# Patient Record
Sex: Male | Born: 1995 | Race: Black or African American | Hispanic: No | Marital: Single | Smoking: Current some day smoker
Health system: Southern US, Community
[De-identification: ages and names within clinical notes are randomized; demographics above are authoritative.]

## PROBLEM LIST (undated history)

## (undated) DIAGNOSIS — R569 Unspecified convulsions: Secondary | ICD-10-CM

---
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
## 2013-01-04 ENCOUNTER — Encounter (HOSPITAL_COMMUNITY): Payer: Self-pay | Admitting: *Deleted

## 2013-01-04 ENCOUNTER — Emergency Department (HOSPITAL_COMMUNITY)
Admission: EM | Admit: 2013-01-04 | Discharge: 2013-01-04 | Disposition: A | Discharge status: Home / Self Care | Payer: No Typology Code available for payment source | Attending: Emergency Medicine | Admitting: Emergency Medicine

## 2013-01-04 ENCOUNTER — Emergency Department (HOSPITAL_COMMUNITY): Payer: No Typology Code available for payment source

## 2013-01-04 DIAGNOSIS — Y92410 Unspecified street and highway as the place of occurrence of the external cause: Secondary | ICD-10-CM | POA: Insufficient documentation

## 2013-01-04 DIAGNOSIS — S2239XA Fracture of one rib, unspecified side, initial encounter for closed fracture: Secondary | ICD-10-CM | POA: Insufficient documentation

## 2013-01-04 DIAGNOSIS — S40011A Contusion of right shoulder, initial encounter: Secondary | ICD-10-CM

## 2013-01-04 DIAGNOSIS — S20219A Contusion of unspecified front wall of thorax, initial encounter: Secondary | ICD-10-CM | POA: Insufficient documentation

## 2013-01-04 DIAGNOSIS — S42309A Unspecified fracture of shaft of humerus, unspecified arm, initial encounter for closed fracture: Secondary | ICD-10-CM

## 2013-01-04 DIAGNOSIS — S20211A Contusion of right front wall of thorax, initial encounter: Secondary | ICD-10-CM

## 2013-01-04 DIAGNOSIS — Y939 Activity, unspecified: Secondary | ICD-10-CM | POA: Insufficient documentation

## 2013-01-04 DIAGNOSIS — S40019A Contusion of unspecified shoulder, initial encounter: Secondary | ICD-10-CM | POA: Insufficient documentation

## 2013-01-04 MED ORDER — NAPROXEN 375 MG PO TABS: 375.0000 mg | ORAL_TABLET | Freq: Two times a day (BID) | ORAL | Status: DC | PRN

## 2013-01-04 MED ORDER — IBUPROFEN 400 MG PO TABS
600.0000 mg | ORAL_TABLET | Freq: Once | ORAL | Status: AC
  Administered 2013-01-04: 600 mg via ORAL
  Filled 2013-01-04: qty 1

## 2013-01-04 NOTE — ED Notes (Signed)
Pt's arm placed in sling.  Pt reports feeling better.  Pt's respirations are equal and non labored.
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
## 2013-01-18 ENCOUNTER — Ambulatory Visit: Payer: No Typology Code available for payment source | Attending: Family Medicine

## 2013-01-18 DIAGNOSIS — IMO0001 Reserved for inherently not codable concepts without codable children: Secondary | ICD-10-CM | POA: Insufficient documentation

## 2013-01-18 DIAGNOSIS — M25519 Pain in unspecified shoulder: Secondary | ICD-10-CM | POA: Insufficient documentation

## 2013-01-20 ENCOUNTER — Ambulatory Visit: Payer: Self-pay

## 2013-01-22 ENCOUNTER — Ambulatory Visit: Payer: Self-pay | Admitting: Rehabilitation

## 2013-01-26 ENCOUNTER — Ambulatory Visit: Payer: Self-pay | Admitting: Rehabilitation

## 2013-01-29 ENCOUNTER — Ambulatory Visit: Payer: No Typology Code available for payment source | Attending: Family Medicine

## 2013-01-29 DIAGNOSIS — IMO0001 Reserved for inherently not codable concepts without codable children: Secondary | ICD-10-CM | POA: Insufficient documentation

## 2013-01-29 DIAGNOSIS — M25519 Pain in unspecified shoulder: Secondary | ICD-10-CM | POA: Insufficient documentation

## 2013-02-02 ENCOUNTER — Ambulatory Visit: Payer: No Typology Code available for payment source | Admitting: Rehabilitation

## 2013-02-04 ENCOUNTER — Encounter: Payer: Self-pay | Admitting: Rehabilitation

## 2013-02-15 DIAGNOSIS — S42309A Unspecified fracture of shaft of humerus, unspecified arm, initial encounter for closed fracture: Secondary | ICD-10-CM

## 2013-03-17 ENCOUNTER — Encounter (HOSPITAL_COMMUNITY): Payer: Self-pay | Admitting: Family Medicine

## 2013-03-17 ENCOUNTER — Emergency Department (HOSPITAL_COMMUNITY)
Admission: EM | Admit: 2013-03-17 | Discharge: 2013-03-17 | Discharge status: Against Medical Advice | Payer: No Typology Code available for payment source | Attending: Emergency Medicine | Admitting: Emergency Medicine

## 2013-03-17 DIAGNOSIS — M549 Dorsalgia, unspecified: Secondary | ICD-10-CM | POA: Insufficient documentation

## 2013-03-17 NOTE — ED Notes (Signed)
Patient called by Landry Corporal, NT without an answer x 3.

## 2013-03-17 NOTE — ED Notes (Signed)
Pt complaining of back pain since the beginning of the month. Denies injury.
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
## 2015-07-09 ENCOUNTER — Emergency Department (HOSPITAL_COMMUNITY)
Admission: EM | Admit: 2015-07-09 | Discharge: 2015-07-09 | Disposition: A | Discharge status: Home / Self Care | Payer: Self-pay | Attending: Emergency Medicine | Admitting: Emergency Medicine

## 2015-07-09 ENCOUNTER — Encounter (HOSPITAL_COMMUNITY): Payer: Self-pay | Admitting: *Deleted

## 2015-07-09 DIAGNOSIS — Z202 Contact with and (suspected) exposure to infections with a predominantly sexual mode of transmission: Secondary | ICD-10-CM | POA: Insufficient documentation

## 2015-07-09 MED ORDER — CEFTRIAXONE SODIUM 250 MG IJ SOLR
250.0000 mg | Freq: Once | INTRAMUSCULAR | Status: DC
  Filled 2015-07-09: qty 250

## 2015-07-09 MED ORDER — METRONIDAZOLE 500 MG PO TABS
2000.0000 mg | ORAL_TABLET | Freq: Once | ORAL | Status: AC
  Administered 2015-07-09: 2000 mg via ORAL
  Filled 2015-07-09: qty 4

## 2015-07-09 MED ORDER — LIDOCAINE HCL (PF) 1 % IJ SOLN
5.0000 mL | Freq: Once | INTRAMUSCULAR | Status: AC
  Administered 2015-07-09: 5 mL
  Filled 2015-07-09: qty 5

## 2015-07-09 MED ORDER — AZITHROMYCIN 250 MG PO TABS
1000.0000 mg | ORAL_TABLET | Freq: Once | ORAL | Status: AC
  Administered 2015-07-09: 1000 mg via ORAL
  Filled 2015-07-09: qty 4

## 2015-07-09 NOTE — Discharge Instructions (Signed)
Please follow-up with health department as needed. You will be contacted if any of your tests come back positive. Always use protection when having intercourse  Sexually Transmitted Disease A sexually transmitted disease (STD) is a disease or infection that may be passed (transmitted) from person to person, usually during sexual activity. This may happen by way of saliva, semen, blood, vaginal mucus, or urine. Common STDs include:  Gonorrhea.  Chlamydia.  Syphilis.  HIV and AIDS.  Genital herpes.  Hepatitis B and C.  Trichomonas.  Human papillomavirus (HPV).  Pubic lice.  Scabies.  Mites.  Bacterial vaginosis. WHAT ARE CAUSES OF STDs? An STD may be caused by bacteria, a virus, or parasites. STDs are often transmitted during sexual activity if one person is infected. However, they may also be transmitted through nonsexual means. STDs may be transmitted after:   Sexual intercourse with an infected person.  Sharing sex toys with an infected person.  Sharing needles with an infected person or using unclean piercing or tattoo needles.  Having intimate contact with the genitals, mouth, or rectal areas of an infected person.  Exposure to infected fluids during birth. WHAT ARE THE SIGNS AND SYMPTOMS OF STDs? Different STDs have different symptoms. Some people may not have any symptoms. If symptoms are present, they may include:  Painful or bloody urination.  Pain in the pelvis, abdomen, vagina, anus, throat, or eyes.  A skin rash, itching, or irritation.  Growths, ulcerations, blisters, or sores in the genital and anal areas.  Abnormal vaginal discharge with or without bad odor.  Penile discharge in men.  Fever.  Pain or bleeding during sexual intercourse.  Swollen glands in the groin area.  Yellow skin and eyes (jaundice). This is seen with hepatitis.  Swollen testicles.  Infertility.  Sores and blisters in the mouth. HOW ARE STDs DIAGNOSED? To make a  diagnosis, your health care provider may:  Take a medical history.  Perform a physical exam.  Take a sample of any discharge to examine.  Swab the throat, cervix, opening to the penis, rectum, or vagina for testing.  Test a sample of your first morning urine.  Perform blood tests.  Perform a Pap test, if this applies.  Perform a colposcopy.  Perform a laparoscopy. HOW ARE STDs TREATED? Treatment depends on the STD. Some STDs may be treated but not cured.  Chlamydia, gonorrhea, trichomonas, and syphilis can be cured with antibiotic medicine.  Genital herpes, hepatitis, and HIV can be treated, but not cured, with prescribed medicines. The medicines lessen symptoms.  Genital warts from HPV can be treated with medicine or by freezing, burning (electrocautery), or surgery. Warts may come back.  HPV cannot be cured with medicine or surgery. However, abnormal areas may be removed from the cervix, vagina, or vulva.  If your diagnosis is confirmed, your recent sexual partners need treatment. This is true even if they are symptom-free or have a negative culture or evaluation. They should not have sex until their health care providers say it is okay.  Your health care provider may test you for infection again 3 months after treatment. HOW CAN I REDUCE MY RISK OF GETTING AN STD? Take these steps to reduce your risk of getting an STD:  Use latex condoms, dental dams, and water-soluble lubricants during sexual activity. Do not use petroleum jelly or oils.  Avoid having multiple sex partners.  Do not have sex with someone who has other sex partners  Do not have sex with anyone you do  not know or who is at high risk for an STD.  Avoid risky sex practices that can break your skin.  Do not have sex if you have open sores on your mouth or skin.  Avoid drinking too much alcohol or taking illegal drugs. Alcohol and drugs can affect your judgment and put you in a vulnerable  position.  Avoid engaging in oral and anal sex acts.  Get vaccinated for HPV and hepatitis. If you have not received these vaccines in the past, talk to your health care provider about whether one or both might be right for you.  If you are at risk of being infected with HIV, it is recommended that you take a prescription medicine daily to prevent HIV infection. This is called pre-exposure prophylaxis (PrEP). You are considered at risk if:  You are a man who has sex with other men (MSM).  You are a heterosexual man or woman and are sexually active with more than one partner.  You take drugs by injection.  You are sexually active with a partner who has HIV.  Talk with your health care provider about whether you are at high risk of being infected with HIV. If you choose to begin PrEP, you should first be tested for HIV. You should then be tested every 3 months for as long as you are taking PrEP. WHAT SHOULD I DO IF I THINK I HAVE AN STD?  See your health care provider.  Tell your sexual partner(s). They should be tested and treated for any STDs.  Do not have sex until your health care provider says it is okay. WHEN SHOULD I GET IMMEDIATE MEDICAL CARE? Contact your health care provider right away if:   You have severe abdominal pain.  You are a man and notice swelling or pain in your testicles.  You are a woman and notice swelling or pain in your vagina.   This information is not intended to replace advice given to you by your health care provider. Make sure you discuss any questions you have with your health care provider.   Document Released: 12/07/2002 Document Revised: 10/07/2014 Document Reviewed: 04/06/2013 Elsevier Interactive Patient Education [employer]

## 2015-07-09 NOTE — ED Provider Notes (Signed)
CSN: [id number]     Arrival date & time 07/09/15  1017 History   By signing my name below, I, Arianna Nassar, attest that this documentation has been prepared under the direction and in the presence of Nahomi Hegner, PA-C. Electronically Signed: Octavia Heir, ED Scribe. 07/09/2015. 11:12 AM.      Chief Complaint  Patient presents with  . Exposure to STD      The history is provided by the patient. No language interpreter was used.   HPI Comments: Timothy Lynch is a 19 y.o. male who presents to the Emergency Department with an STD check. Pt notes his baby mother was dx and treated with trichomoniasis and states he wants to be checked out just in case he potentially has something. He states not using protection when having sexual intercourse. Pt denies penile discharge, penile pain and any other symptoms.  No past medical history on file. No past surgical history on file. No family history on file. Social History  Substance Use Topics  . Smoking status: Never Smoker   . Smokeless tobacco: Not on file  . Alcohol Use: No    Review of Systems  Genitourinary: Negative for discharge and penile pain.      Allergies  Review of patient's allergies indicates no known allergies.  Home Medications   Prior to Admission medications   Medication Sig Start Date End Date Taking? Authorizing Provider  diphenhydramine-acetaminophen (TYLENOL PM) 25-500 MG TABS Take 2 tablets by mouth 2 (two) times daily as needed (pain).    Historical Provider, MD   Triage vitals: BP 116/46 mmHg  Pulse 70  Temp(Src) 98.8 F (37.1 C) (Oral)  Resp 18  Ht 6' 3.5" (1.918 m)  Wt 200 lb (90.719 kg)  BMI 24.66 kg/m2  SpO2 100% Physical Exam  Constitutional: He is oriented to person, place, and time. He appears well-developed and well-nourished. No distress.  HENT:  Head: Normocephalic and atraumatic.  Eyes: Conjunctivae are normal. Right eye exhibits no discharge. Left eye exhibits no discharge.   Neck: Neck supple.  Genitourinary:  Normal genitalia. No penile discharge  Neurological: He is alert and oriented to person, place, and time. Coordination normal.  Skin: Skin is warm and dry. No rash noted. He is not diaphoretic.  Psychiatric: He has a normal mood and affect. His behavior is normal.  Nursing note and vitals reviewed.   ED Course  Procedures  DIAGNOSTIC STUDIES: Oxygen Saturation is 100% on RA, normal by my interpretation.  COORDINATION OF CARE:  11:12 AM Discussed treatment plan with pt at bedside and pt agreed to plan.  Labs Review Labs Reviewed - No data to display  Imaging Review No results found. I have personally reviewed and evaluated these images and lab results as part of my medical decision-making.   EKG Interpretation None      MDM   Final diagnoses:  Exposure to STD     patient with possible exposure to STD. He denies any complaints. Will get GC/ chlamydia, RPR, HIV. Will treat with Rocephin, Zithromax, Flagyl.  Patient refused Rocephin IM. I have instructed him to update his number so we can give him a call if any of the cultures come back positive he needs to be treated. Patient will be discharged home. Follow up with health department  Filed Vitals:   07/09/15 1053 07/09/15 1147  BP: 116/46 124/85  Pulse: 70 70  Temp: 98.8 F (37.1 C) 97.9 F (36.6 C)  TempSrc: Oral Oral  Resp:  18 17  Height: 6' 3.5" (1.918 m)   Weight: 200 lb (90.719 kg)   SpO2: 100% 100%     Jaynie Crumble, PA-C 07/09/15 1317  Nelva Nay, MD 07/09/15 1535

## 2015-07-09 NOTE — ED Notes (Signed)
Pt partner was Dx with a STD and Pt wanted to be checked.

## 2015-07-09 NOTE — ED Notes (Signed)
Declined W/C at D/C and was escorted to lobby by RN. 

## 2015-07-10 LAB — RPR: RPR Ser Ql: NONREACTIVE

## 2015-07-10 LAB — HIV ANTIBODY (ROUTINE TESTING W REFLEX): HIV SCREEN 4TH GENERATION: NONREACTIVE

## 2015-07-10 LAB — GC/CHLAMYDIA PROBE AMP (~~LOC~~) NOT AT ARMC
Chlamydia: NEGATIVE
Neisseria Gonorrhea: NEGATIVE

## 2019-03-22 ENCOUNTER — Other Ambulatory Visit: Payer: Self-pay

## 2019-03-22 ENCOUNTER — Emergency Department (HOSPITAL_COMMUNITY)
Admission: EM | Admit: 2019-03-22 | Discharge: 2019-03-22 | Disposition: A | Discharge status: Home / Self Care | Payer: Self-pay | Attending: Emergency Medicine | Admitting: Emergency Medicine

## 2019-03-22 DIAGNOSIS — R197 Diarrhea, unspecified: Secondary | ICD-10-CM | POA: Insufficient documentation

## 2019-03-22 DIAGNOSIS — R112 Nausea with vomiting, unspecified: Secondary | ICD-10-CM | POA: Insufficient documentation

## 2019-03-22 LAB — LIPASE, BLOOD: Lipase: 42 U/L (ref 11–51)

## 2019-03-22 LAB — COMPREHENSIVE METABOLIC PANEL
ALT: 47 U/L — ABNORMAL HIGH (ref 0–44)
AST: 58 U/L — ABNORMAL HIGH (ref 15–41)
Albumin: 3.8 g/dL (ref 3.5–5.0)
Alkaline Phosphatase: 75 U/L (ref 38–126)
Anion gap: 9 (ref 5–15)
BUN: 15 mg/dL (ref 6–20)
CO2: 27 mmol/L (ref 22–32)
Calcium: 8.8 mg/dL — ABNORMAL LOW (ref 8.9–10.3)
Chloride: 100 mmol/L (ref 98–111)
Creatinine, Ser: 1.24 mg/dL (ref 0.61–1.24)
GFR calc Af Amer: >60 mL/min (ref >60)
GFR calc non Af Amer: >60 mL/min (ref >60)
Glucose, Bld: 104 mg/dL — ABNORMAL HIGH (ref 70–99)
Potassium: 3.3 mmol/L — ABNORMAL LOW (ref 3.5–5.1)
Sodium: 136 mmol/L (ref 135–145)
Total Bilirubin: 0.4 mg/dL (ref 0.3–1.2)
Total Protein: 7.2 g/dL (ref 6.5–8.1)

## 2019-03-22 LAB — CBC
HCT: 44.7 % (ref 39.0–52.0)
Hemoglobin: 14.7 g/dL (ref 13.0–17.0)
MCH: 32 pg (ref 26.0–34.0)
MCHC: 32.9 g/dL (ref 30.0–36.0)
MCV: 97.4 fL (ref 80.0–100.0)
Platelets: 255 10*3/uL (ref 150–400)
RBC: 4.59 MIL/uL (ref 4.22–5.81)
RDW: 13 % (ref 11.5–15.5)
WBC: 8.1 10*3/uL (ref 4.0–10.5)
nRBC: 0 % (ref 0.0–0.2)

## 2019-03-22 MED ORDER — SODIUM CHLORIDE 0.9 % IV BOLUS (SEPSIS)
2000.0000 mL | Freq: Once | INTRAVENOUS | Status: AC
  Administered 2019-03-22: 07:00:00 2000 mL via INTRAVENOUS

## 2019-03-22 MED ORDER — ONDANSETRON 8 MG PO TBDP: 8.0000 mg | ORAL_TABLET | Freq: Three times a day (TID) | ORAL | 0 refills | Status: DC | PRN

## 2019-03-22 MED ORDER — ONDANSETRON 4 MG PO TBDP
4.0000 mg | ORAL_TABLET | Freq: Once | ORAL | Status: AC | PRN
  Administered 2019-03-22: 4 mg via ORAL
  Filled 2019-03-22: qty 1

## 2019-03-22 MED ORDER — ONDANSETRON HCL 4 MG/2ML IJ SOLN
4.0000 mg | Freq: Once | INTRAMUSCULAR | Status: AC
  Administered 2019-03-22: 4 mg via INTRAVENOUS
  Filled 2019-03-22: qty 2

## 2019-03-22 NOTE — ED Provider Notes (Signed)
Patient accepted at signout from Dr. Christy Gentles.  Plan to reassess after hydration.  Anticipated discharge. Physical Exam  BP 123/87   Pulse 71   Temp 98.1 F (36.7 C) (Oral)   Resp 20   Ht 6\' 3"  (1.905 m)   Wt 91 kg   SpO2 98%   BMI 25.08 kg/m   Physical Exam  ED Course/Procedures     Procedures  MDM  Patient reports he feels improved.  He is now drinking ginger ale.  He has had no further vomiting.  Patient is stable and in no distress.  He is up and standing and taking fluids.  Reviewed return precautions.  Stable for discharge.       Charlesetta Shanks, MD 03/22/19 [phone]

## 2019-03-22 NOTE — ED Triage Notes (Signed)
Pt c/o abd pain, n/v/d that started last night. Last vomited @ 04:00

## 2019-03-22 NOTE — ED Notes (Signed)
Pt verbalized understanding of d/c instructions and has no further questions, VSS, NAD.  

## 2019-03-22 NOTE — Discharge Instructions (Signed)
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
## 2019-07-09 ENCOUNTER — Other Ambulatory Visit: Payer: Self-pay

## 2019-07-09 ENCOUNTER — Emergency Department (HOSPITAL_COMMUNITY)
Admission: EM | Admit: 2019-07-09 | Discharge: 2019-07-10 | Disposition: A | Discharge status: Home / Self Care | Payer: Self-pay | Attending: Emergency Medicine | Admitting: Emergency Medicine

## 2019-07-09 ENCOUNTER — Encounter (HOSPITAL_COMMUNITY): Payer: Self-pay | Admitting: Emergency Medicine

## 2019-07-09 DIAGNOSIS — Y939 Activity, unspecified: Secondary | ICD-10-CM | POA: Insufficient documentation

## 2019-07-09 DIAGNOSIS — Y929 Unspecified place or not applicable: Secondary | ICD-10-CM | POA: Insufficient documentation

## 2019-07-09 DIAGNOSIS — Z5321 Procedure and treatment not carried out due to patient leaving prior to being seen by health care provider: Secondary | ICD-10-CM | POA: Insufficient documentation

## 2019-07-09 DIAGNOSIS — Y999 Unspecified external cause status: Secondary | ICD-10-CM | POA: Insufficient documentation
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
## 2019-07-10 NOTE — ED Notes (Signed)
PT got band aids from Phlebotomy and stated he was leaving. PT not seen in the lobby for past 30 Min

## 2020-10-05 ENCOUNTER — Emergency Department (HOSPITAL_COMMUNITY): Payer: Self-pay

## 2020-10-05 ENCOUNTER — Other Ambulatory Visit: Payer: Self-pay

## 2020-10-05 ENCOUNTER — Encounter (HOSPITAL_COMMUNITY): Payer: Self-pay | Admitting: Emergency Medicine

## 2020-10-05 ENCOUNTER — Emergency Department (HOSPITAL_COMMUNITY)
Admission: EM | Admit: 2020-10-05 | Discharge: 2020-10-05 | Disposition: A | Discharge status: Home / Self Care | Payer: Self-pay | Attending: Emergency Medicine | Admitting: Emergency Medicine

## 2020-10-05 DIAGNOSIS — Z20822 Contact with and (suspected) exposure to covid-19: Secondary | ICD-10-CM | POA: Insufficient documentation

## 2020-10-05 DIAGNOSIS — R1084 Generalized abdominal pain: Secondary | ICD-10-CM | POA: Insufficient documentation

## 2020-10-05 DIAGNOSIS — J069 Acute upper respiratory infection, unspecified: Secondary | ICD-10-CM | POA: Insufficient documentation

## 2020-10-05 DIAGNOSIS — R945 Abnormal results of liver function studies: Secondary | ICD-10-CM | POA: Insufficient documentation

## 2020-10-05 DIAGNOSIS — R7989 Other specified abnormal findings of blood chemistry: Secondary | ICD-10-CM

## 2020-10-05 LAB — CBC
HCT: 45.8 % (ref 39.0–52.0)
Hemoglobin: 14.4 g/dL (ref 13.0–17.0)
MCH: 29.8 pg (ref 26.0–34.0)
MCHC: 31.4 g/dL (ref 30.0–36.0)
MCV: 94.6 fL (ref 80.0–100.0)
Platelets: 356 10*3/uL (ref 150–400)
RBC: 4.84 MIL/uL (ref 4.22–5.81)
RDW: 15.6 % — ABNORMAL HIGH (ref 11.5–15.5)
WBC: 5.7 10*3/uL (ref 4.0–10.5)
nRBC: 0 % (ref 0.0–0.2)

## 2020-10-05 LAB — URINALYSIS, ROUTINE W REFLEX MICROSCOPIC
Bacteria, UA: NONE SEEN
Bilirubin Urine: NEGATIVE
Glucose, UA: NEGATIVE mg/dL
Hgb urine dipstick: NEGATIVE
Ketones, ur: NEGATIVE mg/dL
Nitrite: NEGATIVE
Protein, ur: NEGATIVE mg/dL
Specific Gravity, Urine: 1.004 — ABNORMAL LOW (ref 1.005–1.030)
pH: 6 (ref 5.0–8.0)

## 2020-10-05 LAB — COMPREHENSIVE METABOLIC PANEL
ALT: 146 U/L — ABNORMAL HIGH (ref 0–44)
AST: 152 U/L — ABNORMAL HIGH (ref 15–41)
Albumin: 3.6 g/dL (ref 3.5–5.0)
Alkaline Phosphatase: 80 U/L (ref 38–126)
Anion gap: 9 (ref 5–15)
BUN: 12 mg/dL (ref 6–20)
CO2: 26 mmol/L (ref 22–32)
Calcium: 8.9 mg/dL (ref 8.9–10.3)
Chloride: 101 mmol/L (ref 98–111)
Creatinine, Ser: 1.29 mg/dL — ABNORMAL HIGH (ref 0.61–1.24)
GFR, Estimated: >60 mL/min (ref >60)
Glucose, Bld: 96 mg/dL (ref 70–99)
Potassium: 3.7 mmol/L (ref 3.5–5.1)
Sodium: 136 mmol/L (ref 135–145)
Total Bilirubin: 0.5 mg/dL (ref 0.3–1.2)
Total Protein: 7.2 g/dL (ref 6.5–8.1)

## 2020-10-05 LAB — LIPASE, BLOOD: Lipase: 65 U/L — ABNORMAL HIGH (ref 11–51)

## 2020-10-05 LAB — POC SARS CORONAVIRUS 2 AG -  ED: SARS Coronavirus 2 Ag: NEGATIVE

## 2020-10-05 LAB — SARS CORONAVIRUS 2 (TAT 6-24 HRS): SARS Coronavirus 2: NEGATIVE

## 2020-10-05 MED ORDER — IOHEXOL 300 MG/ML  SOLN
100.0000 mL | Freq: Once | INTRAMUSCULAR | Status: AC
  Administered 2020-10-05: 100 mL via INTRAVENOUS

## 2020-10-05 MED ORDER — MORPHINE SULFATE (PF) 4 MG/ML IV SOLN
4.0000 mg | Freq: Once | INTRAVENOUS | Status: AC
  Administered 2020-10-05: 4 mg via INTRAVENOUS
  Filled 2020-10-05: qty 1

## 2020-10-05 MED ORDER — SODIUM CHLORIDE 0.9 % IV BOLUS
500.0000 mL | Freq: Once | INTRAVENOUS | Status: AC
  Administered 2020-10-05: 500 mL via INTRAVENOUS

## 2020-10-05 MED ORDER — DICYCLOMINE HCL 20 MG PO TABS: 20.0000 mg | ORAL_TABLET | Freq: Three times a day (TID) | ORAL | 0 refills | Status: DC | PRN

## 2020-10-05 MED ORDER — ONDANSETRON HCL 4 MG/2ML IJ SOLN
4.0000 mg | Freq: Once | INTRAMUSCULAR | Status: AC
  Administered 2020-10-05: 4 mg via INTRAVENOUS
  Filled 2020-10-05: qty 2

## 2020-10-05 MED ORDER — ONDANSETRON 4 MG PO TBDP: 4.0000 mg | ORAL_TABLET | Freq: Three times a day (TID) | ORAL | 0 refills | Status: DC | PRN

## 2020-10-05 MED ORDER — ONDANSETRON 4 MG PO TBDP
4.0000 mg | ORAL_TABLET | Freq: Once | ORAL | Status: AC
  Administered 2020-10-05: 4 mg via ORAL
  Filled 2020-10-05: qty 1

## 2020-10-05 NOTE — ED Triage Notes (Signed)
Patient with two day history of headache, abdominal pain and diarrhea.  He states that he has a cough which has been going on for two days.

## 2020-10-05 NOTE — Discharge Instructions (Signed)
You were seen in the emergency department today with abdominal discomfort with other respiratory symptoms.  I am testing you for COVID-19 with a PCR test.  This test result will come back in 6 to 24 hours and you can follow the test results in the MyChart app.  Please remain in quarantine until these test results come back and you are feeling better with no fevers.  Your liver enzymes are slightly elevated here today.  Your gallbladder and liver were otherwise normal on the CT scans and ultrasounds.  I need you to establish care with primary care doctor as well as gastroenterologist and I have listed their contact information here.  Please call their offices today to schedule the next available appointments.   If your symptoms worsen significantly such as worsening pain, fever, vomiting, trouble breathing, or chest pain please return to the emergency department immediately for reevaluation.

## 2020-10-05 NOTE — ED Provider Notes (Signed)
Emergency Department Provider Note   I have reviewed the triage vital signs and the nursing notes.   HISTORY  Chief Complaint Abdominal Pain, Diarrhea, and Headache   HPI Timothy Lynch is a 25 y.o. male with past medical history reviewed below presents to the emergency department with 2 days of headache with abdominal discomfort, diarrhea, cough, and mild sore throat.  He describes his abdominal pain as around the sides in the upper abdomen and radiating anteriorly.  He has had some nausea but no vomiting.  He is having nonbloody diarrhea.  Cough is ongoing but denies specific chest pain or shortness of breath.  He does not drink alcohol. No other modifying factors. Has noted loss of appetite.   History reviewed. No pertinent past medical history.  Patient Active Problem List   Diagnosis Date Noted  . Humerus fracture 02/15/2013    History reviewed. No pertinent surgical history.  Allergies Patient has no known allergies.  No family history on file.  Social History Social History   Tobacco Use  . Smoking status: Never Smoker  . Smokeless tobacco: Never Used  Substance Use Topics  . Alcohol use: No  . Drug use: Yes    Types: Marijuana    Review of Systems  Constitutional: Subjective fever.  Eyes: No visual changes. ENT: No sore throat. Cardiovascular: Denies chest pain. Respiratory: Denies shortness of breath. Positive cough.  Gastrointestinal: Positive epigastric abdominal pain.  No nausea, no vomiting.  Positive diarrhea.  No constipation. Genitourinary: Negative for dysuria. Musculoskeletal: Negative for back pain. Positive body aches.  Skin: Negative for rash. Neurological: Negative for focal weakness or numbness. Positive HA.   10-point ROS otherwise negative.  ____________________________________________   PHYSICAL EXAM:  VITAL SIGNS: ED Triage Vitals  Enc Vitals Group     BP 10/05/20 0011 127/64     Pulse Rate 10/05/20 0011 92     Resp  10/05/20 0011 16     Temp 10/05/20 0011 98.3 F (36.8 C)     Temp Source 10/05/20 0011 Oral     SpO2 10/05/20 0011 99 %   Constitutional: Alert and oriented. Well appearing and in no acute distress. Eyes: Conjunctivae are normal.  Head: Atraumatic. Nose: No congestion/rhinnorhea. Mouth/Throat: Mucous membranes are moist.  Neck: No stridor.   Cardiovascular: Normal rate, regular rhythm. Good peripheral circulation. Grossly normal heart sounds.   Respiratory: Normal respiratory effort.  No retractions. Lungs CTAB. Gastrointestinal: Soft with tenderness throughout the epigastric region. Negative Murphy's sign. No peritoneal findings. Non-tender lower abdomen. No distention.  Musculoskeletal: No gross deformities of extremities. Neurologic:  Normal speech and language Skin:  Skin is warm, dry and intact. No rash noted.   ____________________________________________   LABS (all labs ordered are listed, but only abnormal results are displayed)  Labs Reviewed  LIPASE, BLOOD - Abnormal; Notable for the following components:      Result Value   Lipase 65 (*)    All other components within normal limits  COMPREHENSIVE METABOLIC PANEL - Abnormal; Notable for the following components:   Creatinine, Ser 1.29 (*)    AST 152 (*)    ALT 146 (*)    All other components within normal limits  CBC - Abnormal; Notable for the following components:   RDW 15.6 (*)    All other components within normal limits  URINALYSIS, ROUTINE W REFLEX MICROSCOPIC - Abnormal; Notable for the following components:   Color, Urine STRAW (*)    Specific Gravity, Urine 1.004 (*)  Leukocytes,Ua SMALL (*)    All other components within normal limits  SARS CORONAVIRUS 2 (TAT 6-24 HRS)  POC SARS CORONAVIRUS 2 AG -  ED   ____________________________________________  RADIOLOGY  CT ABDOMEN PELVIS W CONTRAST  Result Date: 10/05/2020 CLINICAL DATA:  Diffuse abdominal pain.  Fever, diarrhea EXAM: CT ABDOMEN AND  PELVIS WITH CONTRAST TECHNIQUE: Multidetector CT imaging of the abdomen and pelvis was performed using the standard protocol following bolus administration of intravenous contrast. CONTRAST:  OMNIPAQUE IOHEXOL 300 MG/ML  SOLN COMPARISON:  None. FINDINGS: Lower chest: Lung bases clear bilaterally. Hepatobiliary: No focal liver abnormality is seen. No gallstones, gallbladder wall thickening, or biliary dilatation. Pancreas: Negative Spleen: Negative Adrenals/Urinary Tract: Adrenal glands are unremarkable. Kidneys are normal, without renal calculi, focal lesion, or hydronephrosis. Bladder is unremarkable. Stomach/Bowel: Negative for bowel obstruction. Negative for bowel mass or edema. Normal appendix. Vascular/Lymphatic: No significant vascular findings are present. No enlarged abdominal or pelvic lymph nodes. Reproductive: Negative Other: No free fluid.  Negative for hernia. Musculoskeletal: Mild lumbar disc degeneration. No acute skeletal abnormality. IMPRESSION: No cause for acute abdominal pain or fever identified. Normal appendix. Electronically Signed   By: Marlan Palau M.D.   On: 10/05/2020 13:03   US Abdomen Limited RUQ (LIVER/GB)  Result Date: 10/05/2020 CLINICAL DATA:  Elevated liver function tests. EXAM: ULTRASOUND ABDOMEN LIMITED RIGHT UPPER QUADRANT COMPARISON:  CT of same day. FINDINGS: Gallbladder: Mild gallbladder wall thickening is noted at 3 mm which may be due to contracted state. No pericholecystic fluid is noted. No gallstones visualized. No sonographic Murphy sign noted by sonographer. Common bile duct: Diameter: 4 mm which is within normal limits. Liver: No focal lesion identified. Within normal limits in parenchymal echogenicity. Portal vein is patent on color Doppler imaging with normal direction of blood flow towards the liver. Other: None. IMPRESSION: Mild gallbladder wall thickening is noted which may be due to contracted state. No cholelithiasis is noted. No other abnormality  seen in the right upper quadrant the abdomen. Electronically Signed   By: Lupita Raider M.D.   On: 10/05/2020 14:15    ____________________________________________   PROCEDURES  Procedure(s) performed:   Procedures  None  ____________________________________________   INITIAL IMPRESSION / ASSESSMENT AND PLAN / ED COURSE  Pertinent labs & imaging results that were available during my care of the patient were reviewed by me and considered in my medical decision making (see chart for details).   Patient presents to the emergency department valuation of abdominal pain with multiple URI symptoms.  COVID-19 is a consideration here and relatively high on my differential.  Patient is having some focal tenderness on abdominal exam with subjective fever at home.  Lipase is slightly elevated but remaining labs are largely unremarkable.  No chest pain or shortness of breath symptoms to suspect ACS/PE.  Plan for CT abdomen pelvis for further evaluation and will treat pain symptoms here.  Plan for antigen Covid test followed by PCR if negative.   CT scan reviewed showing no acute process.  Patient does have some elevated LFTs on labs.  Has had mild elevation in the past but slightly more today.  Normal bilirubin.  Ultrasound shows mild gallbladder wall thickening but no cholelithiasis or other findings to suspect acute cholecystitis.  No dilation of the biliary system.  Patient is feeling better in the ED after treatment.  Plan for PCP and GI follow-up.  Information provided at discharge and patient will call today to schedule appointments.  Discussed ED  return precautions in detail.  Covid testing is pending and patient will remain in quarantine and check the test results in the MyChart app. ____________________________________________  FINAL CLINICAL IMPRESSION(S) / ED DIAGNOSES  Final diagnoses:  Elevated LFTs  Generalized abdominal pain  Viral upper respiratory tract infection      MEDICATIONS GIVEN DURING THIS VISIT:  Medications  ondansetron (ZOFRAN-ODT) disintegrating tablet 4 mg (4 mg Oral Given 10/05/20 0153)  sodium chloride 0.9 % bolus 500 mL (0 mLs Intravenous Stopped 10/05/20 1256)  morphine 4 MG/ML injection 4 mg (4 mg Intravenous Given 10/05/20 1059)  ondansetron (ZOFRAN) injection 4 mg (4 mg Intravenous Given 10/05/20 1059)  iohexol (OMNIPAQUE) 300 MG/ML solution 100 mL (100 mLs Intravenous Contrast Given 10/05/20 1254)  morphine 4 MG/ML injection 4 mg (4 mg Intravenous Given 10/05/20 1423)     NEW OUTPATIENT MEDICATIONS STARTED DURING THIS VISIT:  Discharge Medication List as of 10/05/2020  2:23 PM    START taking these medications   Details  dicyclomine (BENTYL) 20 MG tablet Take 1 tablet (20 mg total) by mouth 3 (three) times daily as needed for spasms., Starting Thu 10/05/2020, Normal        Note:  This document was prepared using Dragon voice recognition software and may include unintentional dictation errors.  Nanda Quinton, MD, [facility]    Jaice Lague, [facility], MD 10/06/20 [phone]

## 2021-06-13 ENCOUNTER — Encounter (HOSPITAL_COMMUNITY): Payer: Self-pay

## 2021-06-13 ENCOUNTER — Ambulatory Visit (HOSPITAL_COMMUNITY)
Admission: EM | Admit: 2021-06-13 | Discharge: 2021-06-13 | Disposition: A | Discharge status: Home / Self Care | Payer: Self-pay | Attending: Physician Assistant | Admitting: Physician Assistant

## 2021-06-13 DIAGNOSIS — R21 Rash and other nonspecific skin eruption: Secondary | ICD-10-CM | POA: Insufficient documentation

## 2021-06-13 DIAGNOSIS — L98491 Non-pressure chronic ulcer of skin of other sites limited to breakdown of skin: Secondary | ICD-10-CM

## 2021-06-13 MED ORDER — MUPIROCIN 2 % EX OINT: 1.0000 "application " | TOPICAL_OINTMENT | Freq: Every day | CUTANEOUS | 0 refills | Status: DC

## 2021-06-13 MED ORDER — VALACYCLOVIR HCL 1 G PO TABS: 1000.0000 mg | ORAL_TABLET | Freq: Two times a day (BID) | ORAL | 0 refills | Status: DC

## 2021-06-13 NOTE — ED Triage Notes (Signed)
Pt reports rash in face, leg and groin x 1 week after shaving.

## 2021-06-13 NOTE — ED Provider Notes (Signed)
[facility]    CSN: [id number] Arrival date & time: 06/13/21  1948      History   Chief Complaint Chief Complaint  Patient presents with   Rash    HPI Timothy Lynch is a 25 y.o. male.   Patient presents today with a 1 week history of painful/burning rash in his groin and on his scrotum.  Reports that symptoms began soon after shaving without shaving cream.  He then developed small blisters/pustules that have been leaking since that time.  He has not tried any over-the-counter medications for symptom management.  He denies episodes of similar symptoms in the past.  He denies any prodromal symptoms including fever, general malaise, nausea, vomiting, sore throat, body aches.  He denies any known sick contacts with similar symptoms including exposure to monkey pox.  He is sexually active with male partners.  He has no concern for STI and declined additional testing today.  He has no additional complaints or concerns today.  Denies any changes to personal hygiene products including soaps or detergents.   History reviewed. No pertinent past medical history.  Patient Active Problem List   Diagnosis Date Noted   Humerus fracture 02/15/2013    History reviewed. No pertinent surgical history.     Home Medications    Prior to Admission medications   Medication Sig Start Date End Date Taking? Authorizing Provider  mupirocin ointment (BACTROBAN) 2 % Apply 1 application topically daily. 06/13/21   Lakima Dona, Noberto Retort, PA-C  valACYclovir (VALTREX) 1000 MG tablet Take 1 tablet (1,000 mg total) by mouth 2 (two) times daily for 10 days. 06/13/21 06/23/21  Skylynne Schlechter, Noberto Retort, PA-C    Family History History reviewed. No pertinent family history.  Social History Social History   Tobacco Use   Smoking status: Every Day    Packs/day: 0.50    Types: Cigarettes   Smokeless tobacco: Never  Substance Use Topics   Alcohol use: Yes   Drug use: Yes    Frequency: 7.0 times per week     Types: Marijuana     Allergies   Patient has no known allergies.   Review of Systems Review of Systems  Constitutional:  Negative for activity change, appetite change, fatigue and fever.  Respiratory:  Negative for cough and shortness of breath.   Cardiovascular:  Negative for chest pain.  Gastrointestinal:  Negative for abdominal pain, diarrhea, nausea and vomiting.  Genitourinary:  Positive for genital sores. Negative for frequency, penile discharge, penile pain and urgency.  Skin:  Positive for rash and wound.  Neurological:  Negative for dizziness, light-headedness and headaches.    Physical Exam Triage Vital Signs ED Triage Vitals  Enc Vitals Group     BP 06/13/21 2022 133/80     Pulse Rate 06/13/21 2022 88     Resp 06/13/21 2022 16     Temp 06/13/21 2022 98.4 F (36.9 C)     Temp src --      SpO2 06/13/21 2022 100 %     Weight --      Height --      Head Circumference --      Peak Flow --      Pain Score 06/13/21 2021 0     Pain Loc --      Pain Edu? --      Excl. in GC? --    No data found.  Updated Vital Signs BP 133/80 (BP Location: Right Arm)   Pulse 88  Temp 98.4 F (36.9 C)   Resp 16   SpO2 100%   Visual Acuity Right Eye Distance:   Left Eye Distance:   Bilateral Distance:    Right Eye Near:   Left Eye Near:    Bilateral Near:     Physical Exam Vitals reviewed.  Constitutional:      General: He is awake.     Appearance: Normal appearance. He is normal weight. He is not ill-appearing.     Comments: Very pleasant male appears stated age no acute distress sitting comfortably in exam room  HENT:     Head: Normocephalic and atraumatic.  Cardiovascular:     Rate and Rhythm: Normal rate and regular rhythm.     Heart sounds: Normal heart sounds, S1 normal and S2 normal. No murmur heard. Pulmonary:     Effort: Pulmonary effort is normal.     Breath sounds: Normal breath sounds. No stridor. No wheezing, rhonchi or rales.     Comments: Clear  to auscultation bilaterally Abdominal:     Palpations: Abdomen is soft.     Tenderness: There is no abdominal tenderness.  Genitourinary:    Penis: Circumcised. Lesions present.   Skin:    Comments: Ulcerated and vesicular lesions noted on scrotum, inner thigh, penis.  No active bleeding or drainage noted.  No pustules noted.  Neurological:     Mental Status: He is alert.  Psychiatric:        Behavior: Behavior is cooperative.     UC Treatments / Results  Labs (all labs ordered are listed, but only abnormal results are displayed) Labs Reviewed  HSV CULTURE AND TYPING    EKG   Radiology No results found.  Procedures Procedures (including critical care time)  Medications Ordered in UC Medications - No data to display  Initial Impression / Assessment and Plan / UC Course  I have reviewed the triage vital signs and the nursing notes.  Pertinent labs & imaging results that were available during my care of the patient were reviewed by me and considered in my medical decision making (see chart for details).      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  HSV testing was collected on ulcerated lesions today-results pending.  We will treat with valacyclovir 1000 mg twice daily for 10 days.  We will also cover with Bactroban in case symptoms are related to folliculitis.  Offered patient STI testing which he declined.  He denies any prodromal symptoms, is low risk, and symptoms are not consistent with monkey pox so testing was not obtained today.  Discussed alarm symptoms that warrant emergent evaluation.  Recommend he follow-up with either our clinic or PCP within a week if symptoms have not significantly improved with treatment regimen.  Strict return precautions given to which he expressed understanding.  Final Clinical Impressions(s) / UC  Diagnoses   Final diagnoses:  Skin ulcer, limited to breakdown of skin (HCC)  Rash and nonspecific skin eruption     Discharge Instructions      Use ointment to cover for bacterial infection.  Take medication to cover for viral infection.  Use hypoallergenic soaps and detergents.  Wear loosefitting cotton underwear.  If you have any worsening symptoms you need to be reevaluated.  We will contact you with your results if they are positive.  Please follow-up with your primary care provider  if your symptoms have not improved with medication within a week.  Return sooner to either our clinic or the emergency room if anything worsens.     ED Prescriptions     Medication Sig Dispense Auth. Provider   valACYclovir (VALTREX) 1000 MG tablet  (Status: Discontinued) Take 1 tablet (1,000 mg total) by mouth 2 (two) times daily for 10 days. 20 tablet Maira Christon K, PA-C   mupirocin ointment (BACTROBAN) 2 %  (Status: Discontinued) Apply 1 application topically daily. 22 g Dilia Alemany K, PA-C   mupirocin ointment (BACTROBAN) 2 % Apply 1 application topically daily. 22 g Dezyrae Kensinger K, PA-C   valACYclovir (VALTREX) 1000 MG tablet Take 1 tablet (1,000 mg total) by mouth 2 (two) times daily for 10 days. 20 tablet Truth Wolaver, Noberto Retort, PA-C      PDMP not reviewed this encounter.   Jeani Hawking, PA-C 06/13/21 2111

## 2021-06-13 NOTE — Discharge Instructions (Signed)
Use ointment to cover for bacterial infection.  Take medication to cover for viral infection.  Use hypoallergenic soaps and detergents.  Wear loosefitting cotton underwear.  If you have any worsening symptoms you need to be reevaluated.  We will contact you with your results if they are positive.  Please follow-up with your primary care provider if your symptoms have not improved with medication within a week.  Return sooner to either our clinic or the emergency room if anything worsens.

## 2021-06-14 ENCOUNTER — Telehealth (HOSPITAL_COMMUNITY): Payer: Self-pay

## 2021-06-14 MED ORDER — MUPIROCIN 2 % EX OINT
1.0000 | TOPICAL_OINTMENT | Freq: Every day | CUTANEOUS | 0 refills | Status: DC
Start: 2021-06-14 — End: 2022-10-19

## 2021-06-14 MED ORDER — VALACYCLOVIR HCL 1 G PO TABS
1000.0000 mg | ORAL_TABLET | Freq: Two times a day (BID) | ORAL | 0 refills | Status: AC
Start: 2021-06-14 — End: 2021-06-24

## 2021-06-17 LAB — HSV CULTURE AND TYPING

## 2021-10-03 DIAGNOSIS — Z1152 Encounter for screening for COVID-19: Secondary | ICD-10-CM | POA: Diagnosis not present

## 2021-11-16 IMAGING — CT CT ABD-PELV W/ CM
2 of 4 series · 16 of 46 positions shown, 18 images · IV contrast (APPLIED)
Comparison: None.

CLINICAL DATA: Diffuse abdominal pain.  Fever, diarrhea

EXAM:
CT ABDOMEN AND PELVIS WITH CONTRAST
TECHNIQUE: Multidetector CT imaging of the abdomen and pelvis was performed
using the standard protocol following bolus administration of
intravenous contrast.
CONTRAST:  100mL OMNIPAQUE IOHEXOL 300 MG/ML  SOLN

[Series 3: abdomen 5.0 · axial · 0.81mm/px · z∈[-538,-83]mm · 13 of 101 slices shown, 15 images]
[im 5/101  soft-tissue]
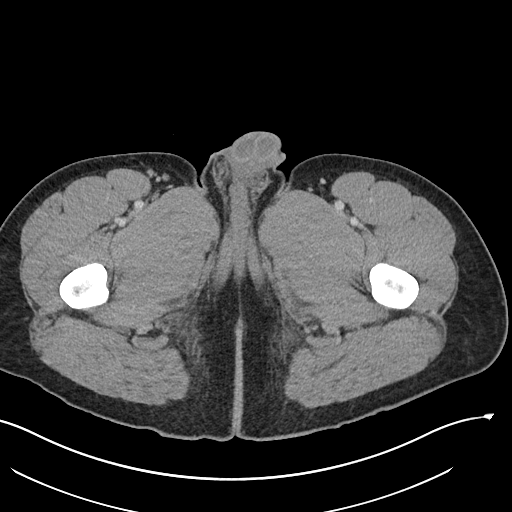
[im 5/101  bone]
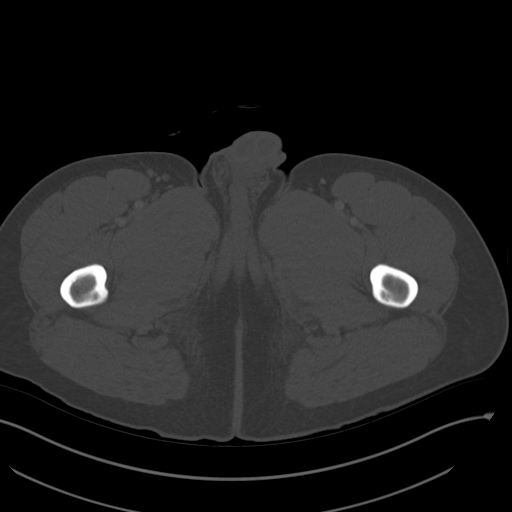
[im 14/101  soft-tissue]
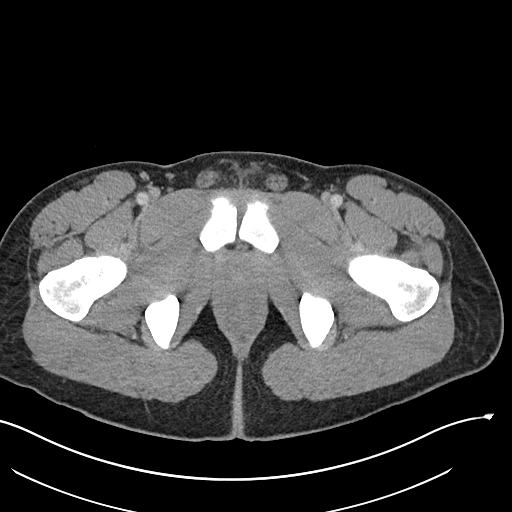
[im 23/101  soft-tissue]
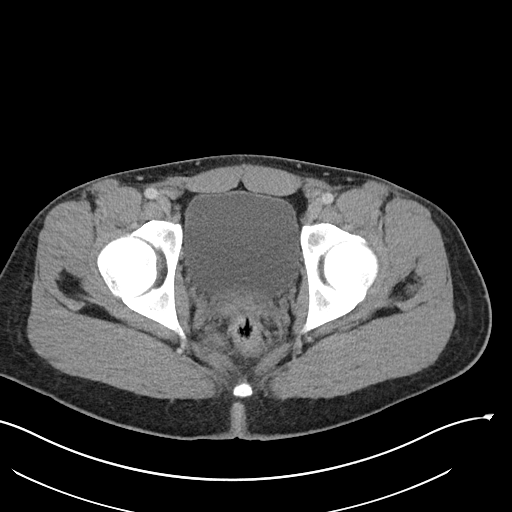
[im 28/101  soft-tissue]
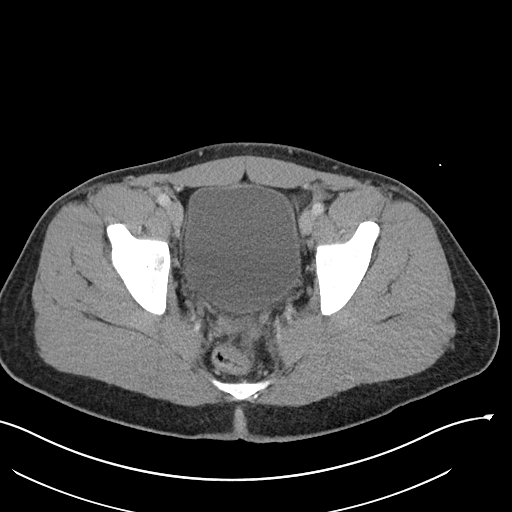
[im 37/101  soft-tissue]
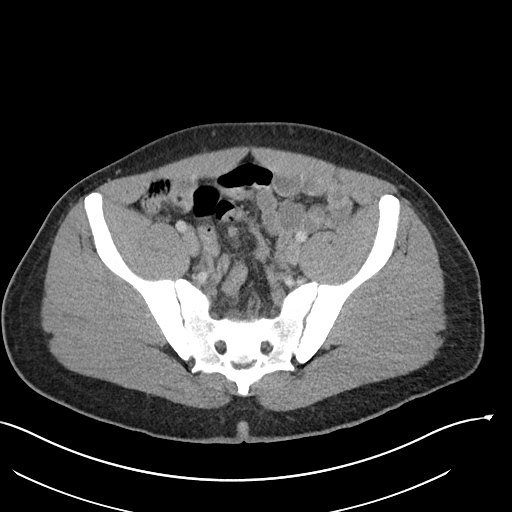
[im 41/101  soft-tissue]
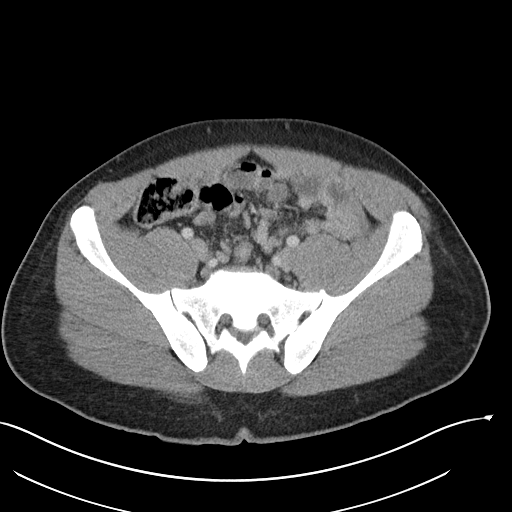
[im 51/101  soft-tissue]
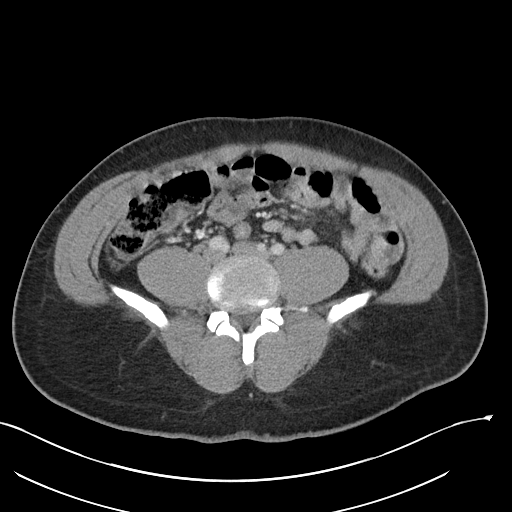
[im 60/101  soft-tissue]
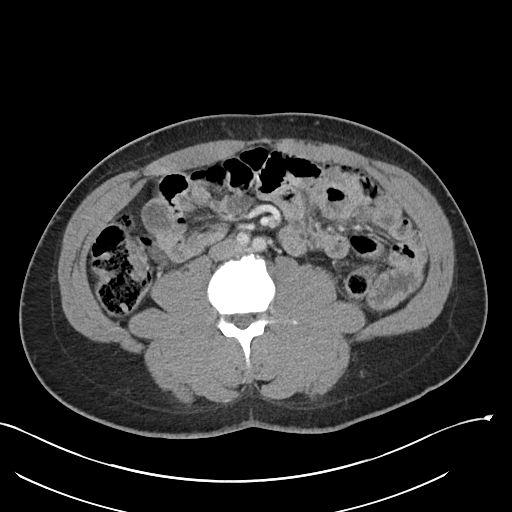
[im 64/101  soft-tissue]
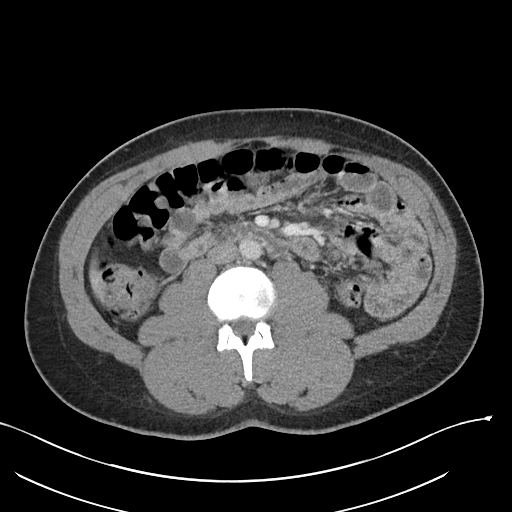
[im 64/101  bone]
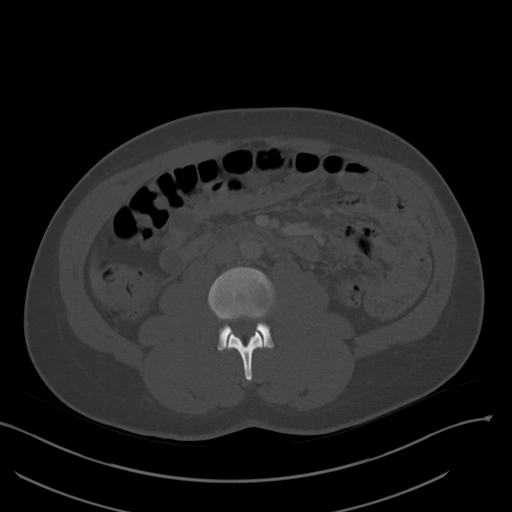
[im 73/101  soft-tissue]
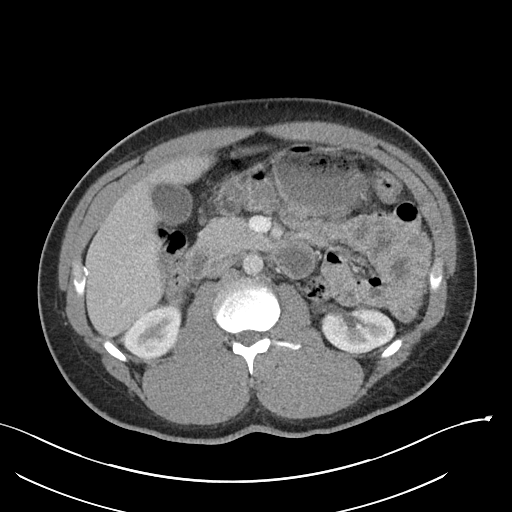
[im 78/101  soft-tissue]
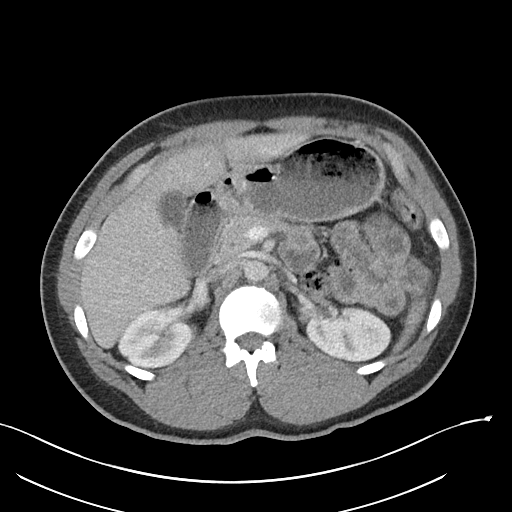
[im 87/101  soft-tissue]
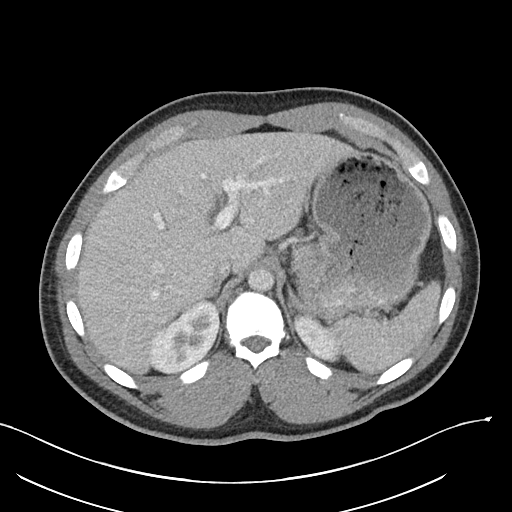
[im 96/101  soft-tissue]
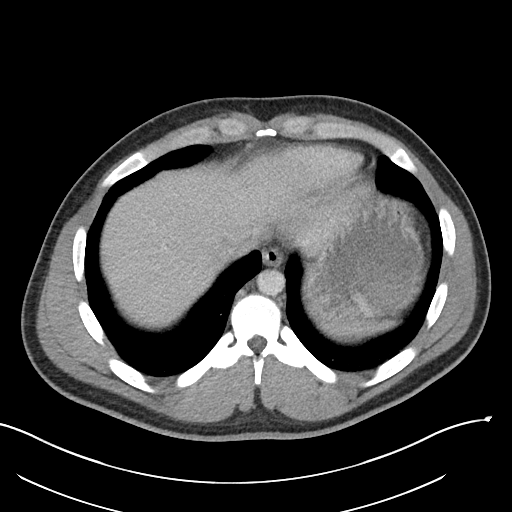

[Series 6: abdomen 3.0 mpr cor · coronal · 0.78mm/px · 3 of 107 slices shown]
[im 36/107  soft-tissue]
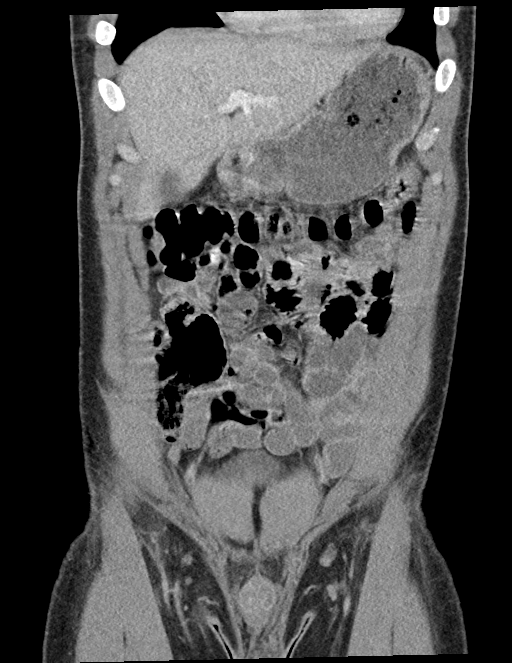
[im 48/107  soft-tissue]
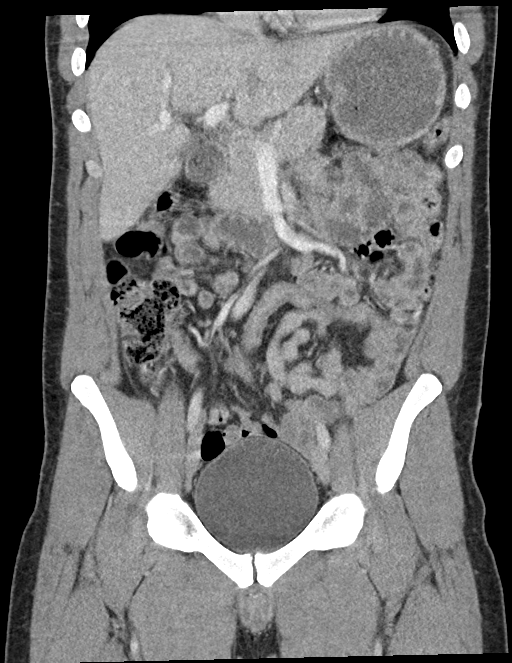
[im 59/107  soft-tissue]
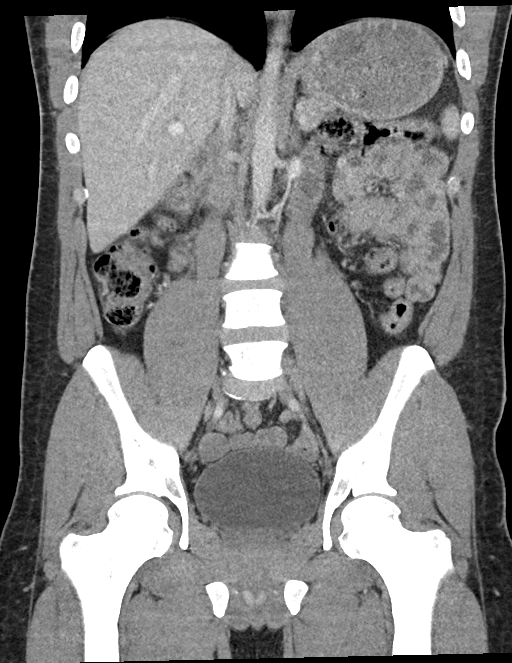

[16 of 46 positions shown; findings below may reference images not displayed]

FINDINGS: Lower chest: Lung bases clear bilaterally.

Hepatobiliary: No focal liver abnormality is seen. No gallstones,
gallbladder wall thickening, or biliary dilatation.

Pancreas: Negative

Spleen: Negative

Adrenals/Urinary Tract: Adrenal glands are unremarkable. Kidneys are
normal, without renal calculi, focal lesion, or hydronephrosis.
Bladder is unremarkable.

Stomach/Bowel: Negative for bowel obstruction. Negative for bowel
mass or edema. Normal appendix.

Vascular/Lymphatic: No significant vascular findings are present. No
enlarged abdominal or pelvic lymph nodes.

Reproductive: Negative

Other: No free fluid.  Negative for hernia.

Musculoskeletal: Mild lumbar disc degeneration. No acute skeletal
abnormality.
IMPRESSION: No cause for acute abdominal pain or fever identified. Normal
appendix.

## 2021-11-16 IMAGING — US US ABDOMEN LIMITED
1 series · 14 of 25 positions shown · non-contrast
Comparison: CT of same day.

CLINICAL DATA: Elevated liver function tests.

EXAM:
ULTRASOUND ABDOMEN LIMITED RIGHT UPPER QUADRANT

[Series 1: us abdomen limited ruq (liver/gb) · 14 of 42 slices shown]
[im 1/42]
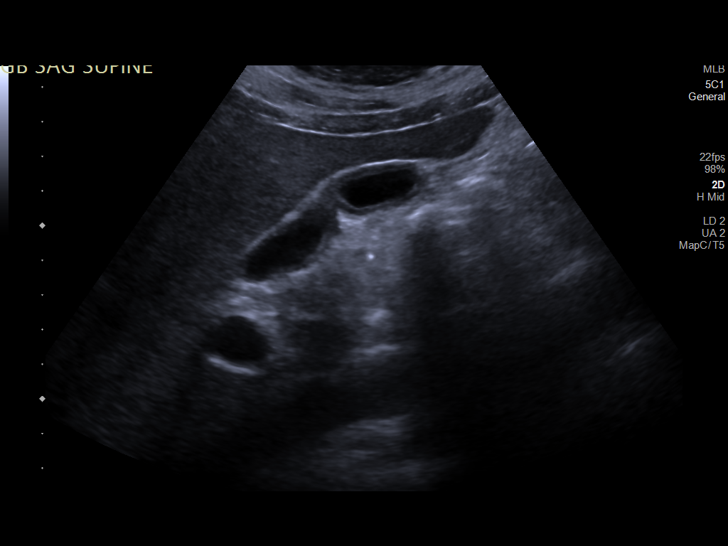
[im 4/42]
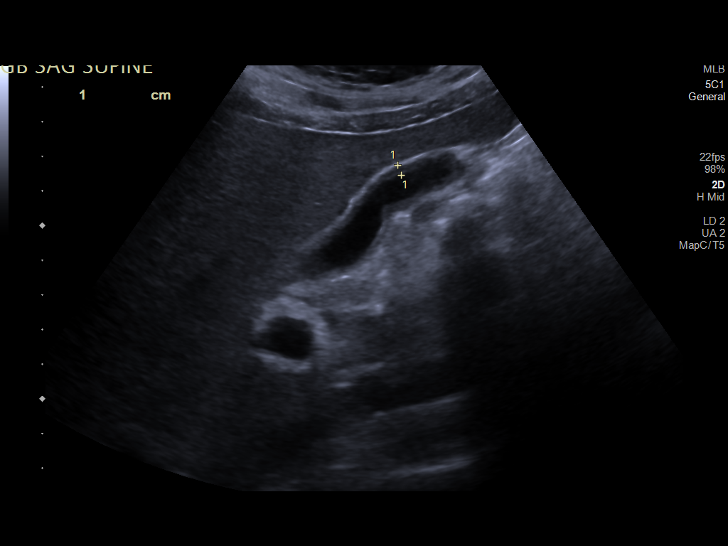
[im 7/42]
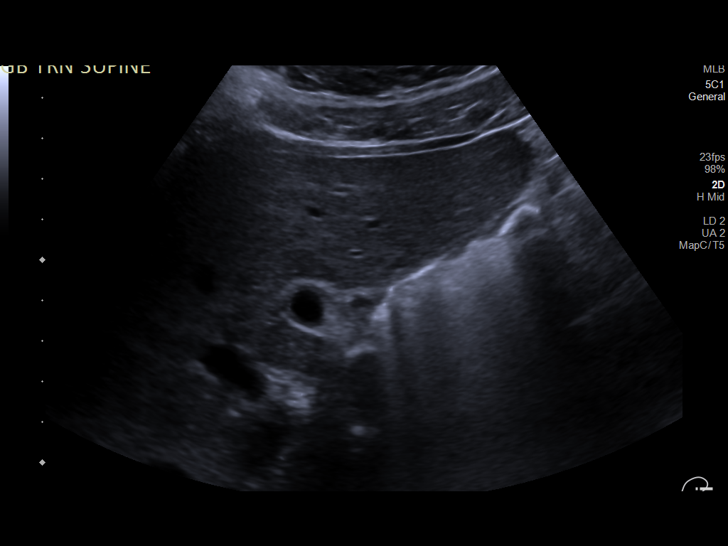
[im 11/42]
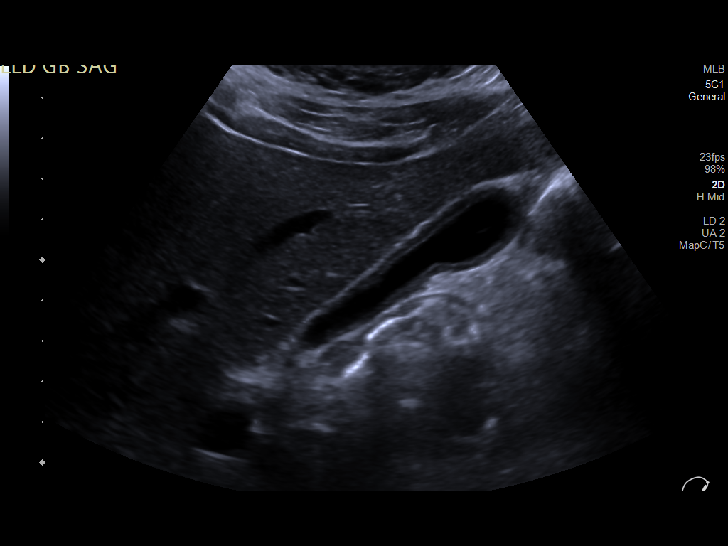
[im 14/42]
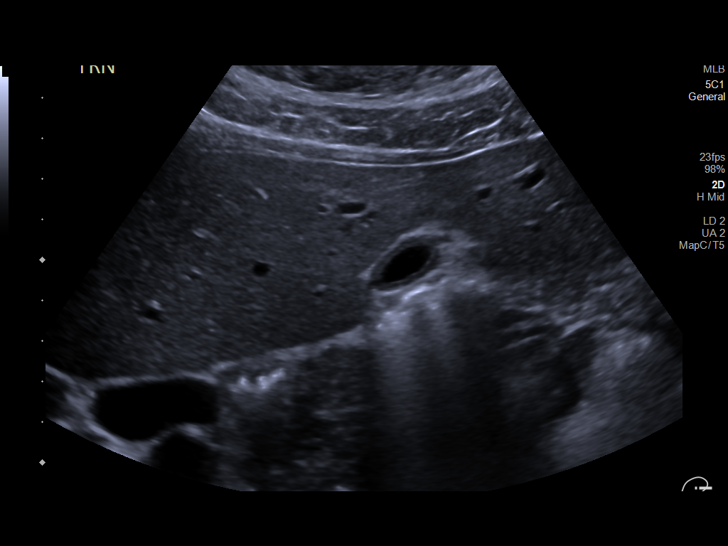
[im 16/42]
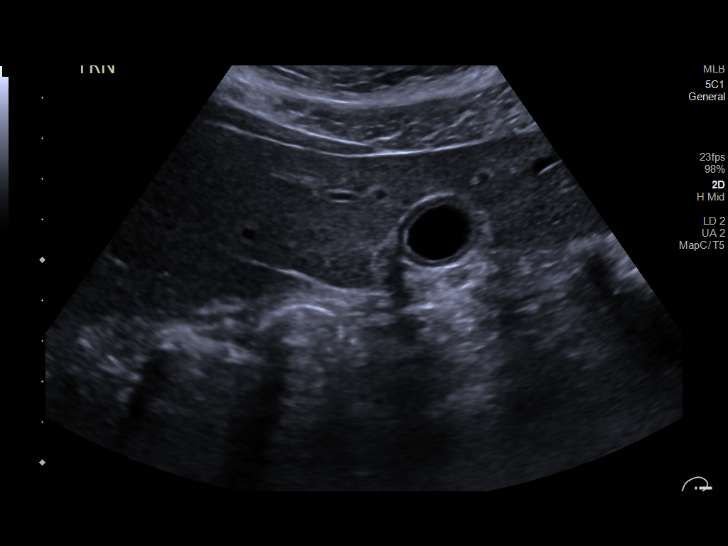
[im 19/42]
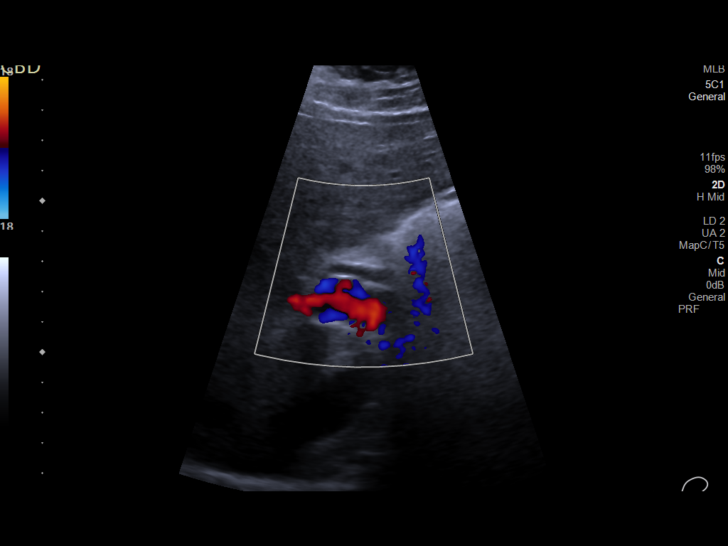
[im 23/42]
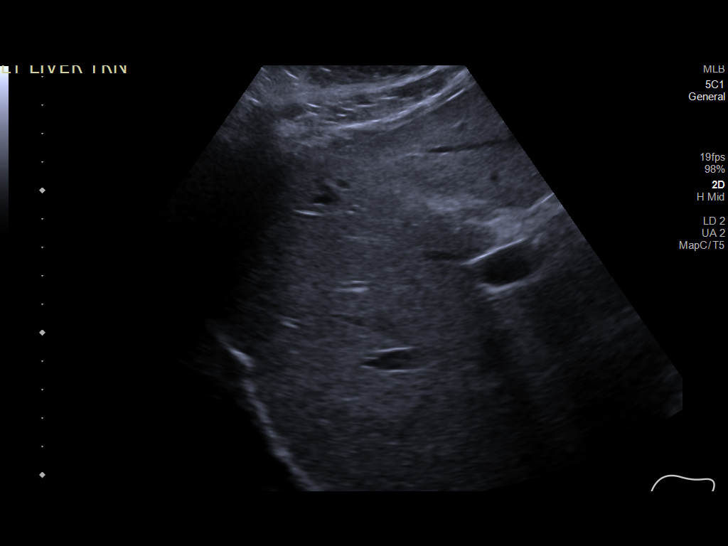
[im 26/42]
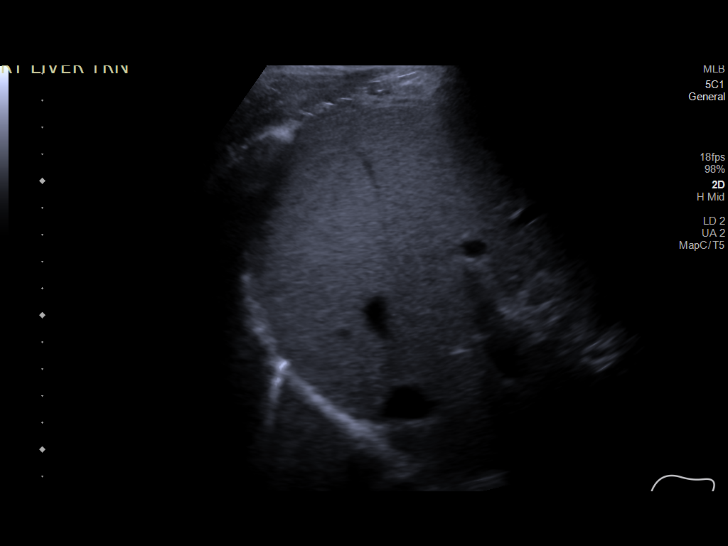
[im 28/42]
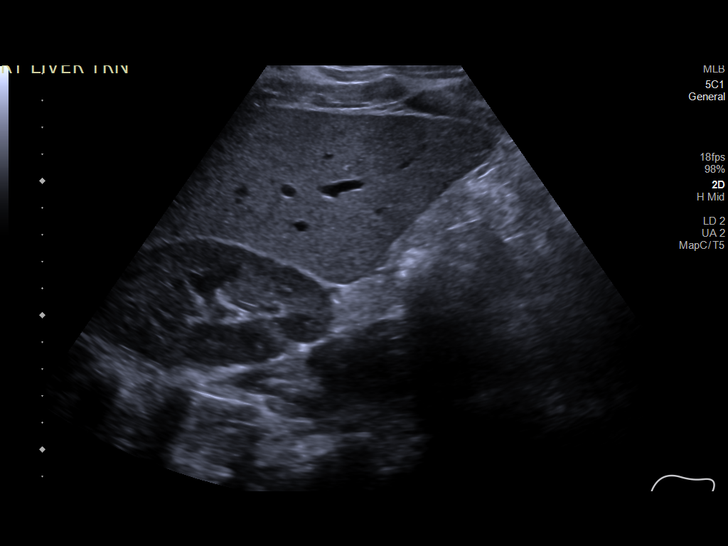
[im 31/42]
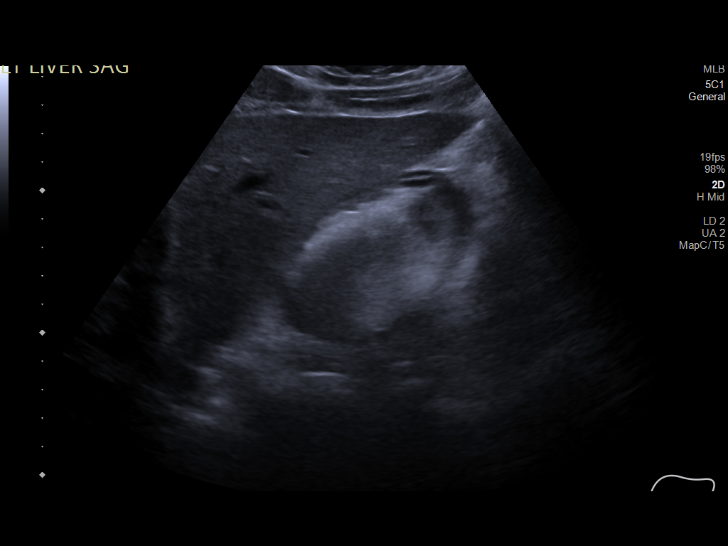
[im 35/42]
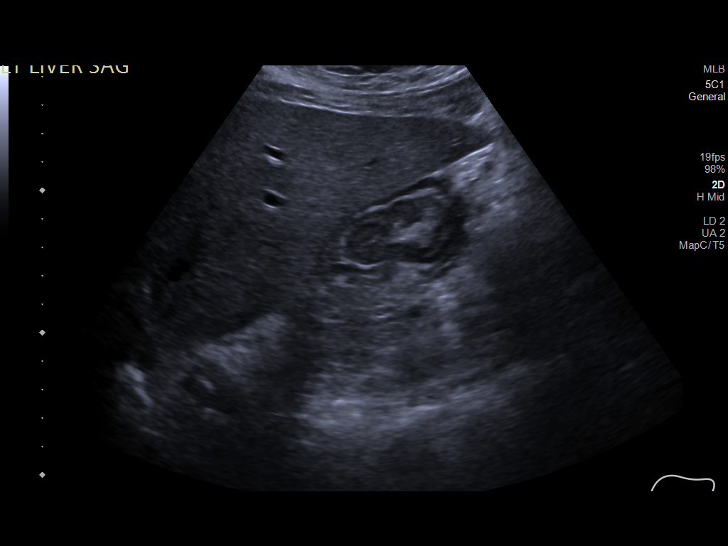
[im 38/42]
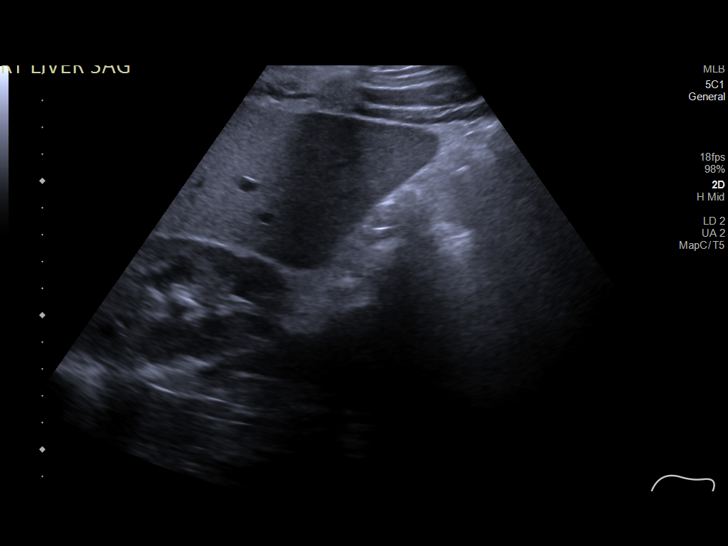
[im 42/42]
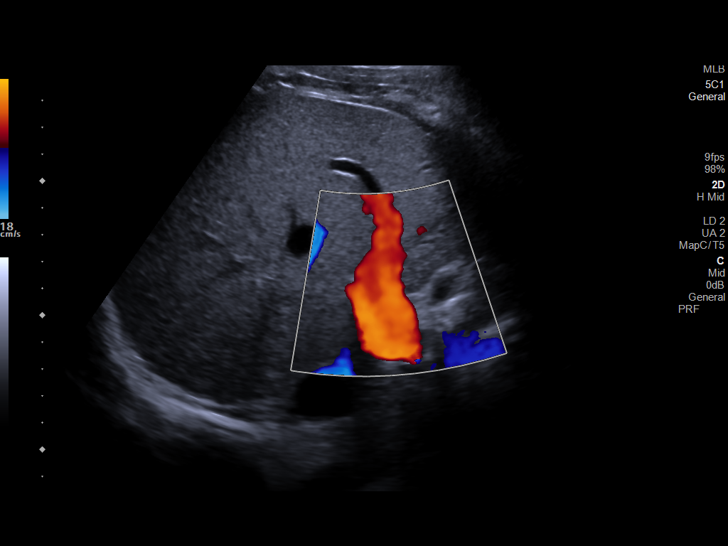

[14 of 25 positions shown; findings below may reference images not displayed]

FINDINGS: Gallbladder:

Mild gallbladder wall thickening is noted at 3 mm which may be due
to contracted state. No pericholecystic fluid is noted. No
gallstones visualized. No sonographic Murphy sign noted by
sonographer.

Common bile duct:

Diameter: 4 mm which is within normal limits.

Liver:

No focal lesion identified. Within normal limits in parenchymal
echogenicity. Portal vein is patent on color Doppler imaging with
normal direction of blood flow towards the liver.

Other: None.
IMPRESSION: Mild gallbladder wall thickening is noted which may be due to
contracted state. No cholelithiasis is noted. No other abnormality
seen in the right upper quadrant the abdomen.

## 2022-10-19 ENCOUNTER — Encounter (HOSPITAL_COMMUNITY): Payer: Self-pay

## 2022-10-19 ENCOUNTER — Emergency Department (HOSPITAL_COMMUNITY): Payer: Self-pay

## 2022-10-19 ENCOUNTER — Other Ambulatory Visit: Payer: Self-pay

## 2022-10-19 ENCOUNTER — Inpatient Hospital Stay (HOSPITAL_COMMUNITY)
Admission: EM | Admit: 2022-10-19 | Discharge: 2022-10-25 | DRG: 100 | Disposition: A | Discharge status: Home / Self Care | Payer: Self-pay | Attending: Pulmonary Disease | Admitting: Pulmonary Disease

## 2022-10-19 DIAGNOSIS — Z1152 Encounter for screening for COVID-19: Secondary | ICD-10-CM

## 2022-10-19 DIAGNOSIS — G928 Other toxic encephalopathy: Secondary | ICD-10-CM | POA: Diagnosis present

## 2022-10-19 DIAGNOSIS — T4275XA Adverse effect of unspecified antiepileptic and sedative-hypnotic drugs, initial encounter: Secondary | ICD-10-CM | POA: Diagnosis not present

## 2022-10-19 DIAGNOSIS — D72819 Decreased white blood cell count, unspecified: Secondary | ICD-10-CM | POA: Diagnosis present

## 2022-10-19 DIAGNOSIS — G40909 Epilepsy, unspecified, not intractable, without status epilepticus: Principal | ICD-10-CM | POA: Diagnosis present

## 2022-10-19 DIAGNOSIS — J45909 Unspecified asthma, uncomplicated: Secondary | ICD-10-CM | POA: Diagnosis present

## 2022-10-19 DIAGNOSIS — Y92239 Unspecified place in hospital as the place of occurrence of the external cause: Secondary | ICD-10-CM | POA: Diagnosis not present

## 2022-10-19 DIAGNOSIS — F05 Delirium due to known physiological condition: Secondary | ICD-10-CM

## 2022-10-19 DIAGNOSIS — J9601 Acute respiratory failure with hypoxia: Secondary | ICD-10-CM | POA: Diagnosis present

## 2022-10-19 DIAGNOSIS — J101 Influenza due to other identified influenza virus with other respiratory manifestations: Secondary | ICD-10-CM | POA: Diagnosis present

## 2022-10-19 DIAGNOSIS — Z781 Physical restraint status: Secondary | ICD-10-CM

## 2022-10-19 DIAGNOSIS — R569 Unspecified convulsions: Principal | ICD-10-CM

## 2022-10-19 DIAGNOSIS — F1290 Cannabis use, unspecified, uncomplicated: Secondary | ICD-10-CM | POA: Diagnosis present

## 2022-10-19 DIAGNOSIS — J690 Pneumonitis due to inhalation of food and vomit: Secondary | ICD-10-CM | POA: Diagnosis present

## 2022-10-19 DIAGNOSIS — I952 Hypotension due to drugs: Secondary | ICD-10-CM | POA: Diagnosis not present

## 2022-10-19 DIAGNOSIS — F17210 Nicotine dependence, cigarettes, uncomplicated: Secondary | ICD-10-CM | POA: Diagnosis present

## 2022-10-19 LAB — I-STAT ARTERIAL BLOOD GAS, ED
Acid-base deficit: 3 mmol/L — ABNORMAL HIGH (ref 0.0–2.0)
Bicarbonate: 21.8 mmol/L (ref 20.0–28.0)
Calcium, Ion: 1.16 mmol/L (ref 1.15–1.40)
HCT: 41 % (ref 39.0–52.0)
Hemoglobin: 13.9 g/dL (ref 13.0–17.0)
O2 Saturation: 100 %
Patient temperature: 100.7
Potassium: 3.3 mmol/L — ABNORMAL LOW (ref 3.5–5.1)
Sodium: 137 mmol/L (ref 135–145)
TCO2: 23 mmol/L (ref 22–32)
pCO2 arterial: 41.3 mmHg (ref 32–48)
pH, Arterial: 7.336 — ABNORMAL LOW (ref 7.35–7.45)
pO2, Arterial: 333 mmHg — ABNORMAL HIGH (ref 83–108)

## 2022-10-19 LAB — CBC WITH DIFFERENTIAL/PLATELET
Abs Immature Granulocytes: 0.04 10*3/uL (ref 0.00–0.07)
Basophils Absolute: 0 10*3/uL (ref 0.0–0.1)
Basophils Relative: 0 %
Eosinophils Absolute: 0 10*3/uL (ref 0.0–0.5)
Eosinophils Relative: 0 %
HCT: 42.4 % (ref 39.0–52.0)
Hemoglobin: 14.5 g/dL (ref 13.0–17.0)
Immature Granulocytes: 1 %
Lymphocytes Relative: 8 %
Lymphs Abs: 0.6 10*3/uL — ABNORMAL LOW (ref 0.7–4.0)
MCH: 31.4 pg (ref 26.0–34.0)
MCHC: 34.2 g/dL (ref 30.0–36.0)
MCV: 91.8 fL (ref 80.0–100.0)
Monocytes Absolute: 1.3 10*3/uL — ABNORMAL HIGH (ref 0.1–1.0)
Monocytes Relative: 16 %
Neutro Abs: 5.9 10*3/uL (ref 1.7–7.7)
Neutrophils Relative %: 75 %
Platelets: 204 10*3/uL (ref 150–400)
RBC: 4.62 MIL/uL (ref 4.22–5.81)
RDW: 12.2 % (ref 11.5–15.5)
WBC: 7.8 10*3/uL (ref 4.0–10.5)
nRBC: 0 % (ref 0.0–0.2)

## 2022-10-19 LAB — URINALYSIS, ROUTINE W REFLEX MICROSCOPIC
Bilirubin Urine: NEGATIVE
Glucose, UA: NEGATIVE mg/dL
Ketones, ur: 20 mg/dL — AB
Leukocytes,Ua: NEGATIVE
Nitrite: NEGATIVE
Protein, ur: 100 mg/dL — AB
Specific Gravity, Urine: 1.019 (ref 1.005–1.030)
pH: 5 (ref 5.0–8.0)

## 2022-10-19 LAB — COMPREHENSIVE METABOLIC PANEL
ALT: 18 U/L (ref 0–44)
AST: 42 U/L — ABNORMAL HIGH (ref 15–41)
Albumin: 4.1 g/dL (ref 3.5–5.0)
Alkaline Phosphatase: 65 U/L (ref 38–126)
Anion gap: 12 (ref 5–15)
BUN: 9 mg/dL (ref 6–20)
CO2: 22 mmol/L (ref 22–32)
Calcium: 8.7 mg/dL — ABNORMAL LOW (ref 8.9–10.3)
Chloride: 100 mmol/L (ref 98–111)
Creatinine, Ser: 0.99 mg/dL (ref 0.61–1.24)
GFR, Estimated: >60 mL/min (ref >60)
Glucose, Bld: 93 mg/dL (ref 70–99)
Potassium: 3.2 mmol/L — ABNORMAL LOW (ref 3.5–5.1)
Sodium: 134 mmol/L — ABNORMAL LOW (ref 135–145)
Total Bilirubin: 1.4 mg/dL — ABNORMAL HIGH (ref 0.3–1.2)
Total Protein: 7.5 g/dL (ref 6.5–8.1)

## 2022-10-19 LAB — RAPID URINE DRUG SCREEN, HOSP PERFORMED
Amphetamines: NOT DETECTED
Barbiturates: NOT DETECTED
Benzodiazepines: POSITIVE — AB
Cocaine: NOT DETECTED
Opiates: NOT DETECTED
Tetrahydrocannabinol: POSITIVE — AB

## 2022-10-19 LAB — RESP PANEL BY RT-PCR (RSV, FLU A&B, COVID)  RVPGX2
Influenza A by PCR: NEGATIVE
Influenza B by PCR: POSITIVE — AB
Resp Syncytial Virus by PCR: NEGATIVE
SARS Coronavirus 2 by RT PCR: NEGATIVE

## 2022-10-19 LAB — ETHANOL: Alcohol, Ethyl (B): <10 mg/dL (ref <10)

## 2022-10-19 LAB — ACETAMINOPHEN LEVEL: Acetaminophen (Tylenol), Serum: <10 ug/mL — ABNORMAL LOW (ref 10–30)

## 2022-10-19 LAB — LACTIC ACID, PLASMA
Lactic Acid, Venous: 0.9 mmol/L (ref 0.5–1.9)
Lactic Acid, Venous: 0.9 mmol/L (ref 0.5–1.9)

## 2022-10-19 LAB — SALICYLATE LEVEL: Salicylate Lvl: <7 mg/dL — ABNORMAL LOW (ref 7.0–30.0)

## 2022-10-19 MED ORDER — PROPOFOL 1000 MG/100ML IV EMUL: 5.0000 ug/kg/min | INTRAVENOUS | Status: DC

## 2022-10-19 MED ORDER — KETAMINE HCL 50 MG/ML IJ SOLN
375.0000 mg | Freq: Once | INTRAMUSCULAR | Status: AC
  Administered 2022-10-19: 375 mg via INTRAMUSCULAR
  Filled 2022-10-19: qty 10

## 2022-10-19 MED ORDER — LEVETIRACETAM 500 MG PO TABS
500.0000 mg | ORAL_TABLET | Freq: Two times a day (BID) | ORAL | Status: DC
  Administered 2022-10-22: 500 mg via ORAL
  Filled 2022-10-19 (×2): qty 1

## 2022-10-19 MED ORDER — LORAZEPAM 2 MG/ML IJ SOLN
2.0000 mg | Freq: Once | INTRAMUSCULAR | Status: AC
  Administered 2022-10-19: 2 mg via INTRAMUSCULAR
  Filled 2022-10-19: qty 1

## 2022-10-19 MED ORDER — HALOPERIDOL LACTATE 5 MG/ML IJ SOLN
INTRAMUSCULAR | Status: AC
  Filled 2022-10-19: qty 1

## 2022-10-19 MED ORDER — STERILE WATER FOR INJECTION IJ SOLN
INTRAMUSCULAR | Status: AC
  Filled 2022-10-19: qty 10

## 2022-10-19 MED ORDER — ACETAMINOPHEN 650 MG RE SUPP
650.0000 mg | Freq: Once | RECTAL | Status: AC
  Administered 2022-10-19: 650 mg via RECTAL
  Filled 2022-10-19: qty 1

## 2022-10-19 MED ORDER — ROCURONIUM BROMIDE 50 MG/5ML IV SOLN
100.0000 mg | Freq: Once | INTRAVENOUS | Status: AC
  Filled 2022-10-19: qty 10

## 2022-10-19 MED ORDER — LORAZEPAM 2 MG/ML IJ SOLN
INTRAMUSCULAR | Status: AC
  Filled 2022-10-19: qty 1

## 2022-10-19 MED ORDER — ETOMIDATE 2 MG/ML IV SOLN
INTRAVENOUS | Status: AC
  Administered 2022-10-19: 20 mg via INTRAVENOUS
  Filled 2022-10-19: qty 10

## 2022-10-19 MED ORDER — ZIPRASIDONE MESYLATE 20 MG IM SOLR
20.0000 mg | Freq: Once | INTRAMUSCULAR | Status: AC
  Administered 2022-10-19: 20 mg via INTRAMUSCULAR
  Filled 2022-10-19: qty 20

## 2022-10-19 MED ORDER — PROPOFOL 1000 MG/100ML IV EMUL
INTRAVENOUS | Status: AC
  Administered 2022-10-19: 10 ug/kg/min via INTRAVENOUS
  Filled 2022-10-19: qty 100

## 2022-10-19 MED ORDER — DIPHENHYDRAMINE HCL 50 MG/ML IJ SOLN
50.0000 mg | Freq: Once | INTRAMUSCULAR | Status: AC
  Administered 2022-10-19: 50 mg via INTRAMUSCULAR
  Filled 2022-10-19: qty 1

## 2022-10-19 MED ORDER — FENTANYL 2500MCG IN NS 250ML (10MCG/ML) PREMIX INFUSION
0.0000 ug/h | INTRAVENOUS | Status: DC
  Administered 2022-10-19: 50 ug/h via INTRAVENOUS
  Administered 2022-10-20 – 2022-10-21 (×4): 200 ug/h via INTRAVENOUS
  Administered 2022-10-21: 250 ug/h via INTRAVENOUS
  Administered 2022-10-21: 200 ug/h via INTRAVENOUS
  Administered 2022-10-22 (×3): 300 ug/h via INTRAVENOUS
  Administered 2022-10-23: 350 ug/h via INTRAVENOUS
  Filled 2022-10-19 (×11): qty 250

## 2022-10-19 MED ORDER — SODIUM CHLORIDE 0.9 % IV BOLUS
1000.0000 mL | Freq: Once | INTRAVENOUS | Status: AC
  Administered 2022-10-19: 1000 mL via INTRAVENOUS

## 2022-10-19 MED ORDER — LEVETIRACETAM IN NACL 500 MG/100ML IV SOLN
500.0000 mg | Freq: Two times a day (BID) | INTRAVENOUS | Status: DC
  Administered 2022-10-20 – 2022-10-21 (×4): 500 mg via INTRAVENOUS
  Filled 2022-10-19 (×5): qty 100

## 2022-10-19 MED ORDER — FENTANYL BOLUS VIA INFUSION
50.0000 ug | INTRAVENOUS | Status: DC | PRN
  Administered 2022-10-20: 100 ug via INTRAVENOUS

## 2022-10-19 MED ORDER — LEVETIRACETAM IN NACL 1000 MG/100ML IV SOLN
2000.0000 mg | Freq: Once | INTRAVENOUS | Status: AC
  Administered 2022-10-19: 2000 mg via INTRAVENOUS
  Filled 2022-10-19: qty 200

## 2022-10-19 MED ORDER — ETOMIDATE 2 MG/ML IV SOLN: 20.0000 mg | Freq: Once | INTRAVENOUS | Status: AC

## 2022-10-19 MED ORDER — ROCURONIUM BROMIDE 10 MG/ML (PF) SYRINGE
PREFILLED_SYRINGE | INTRAVENOUS | Status: AC
  Administered 2022-10-19: 100 mg via INTRAVENOUS
  Filled 2022-10-19: qty 10

## 2022-10-19 NOTE — ED Notes (Signed)
Brother Carlisle Cater [phone] would like an update asap

## 2022-10-19 NOTE — ED Provider Notes (Signed)
[facility] Provider Note   CSN: [id number] Arrival date & time: 10/19/22  1801     History  Chief Complaint  Patient presents with   Seizures    Timothy Lynch is a 27 y.o. male otherwise healthy here presenting with seizure.  Patient apparently had a witnessed seizure by family earlier today.  Afterwards he became very agitated.  EMS got there and he was very agitated and they had to handcuffed him to the stretcher and gave him 10 mg of IM Versed.  No IV access was obtained due to agitation.  Patient got here and patient remains agitated.  Patient unable to give me much history.  Per the family, he is running fevers at home.  He has no history of seizures.  The history is provided by the EMS personnel and a relative.       Home Medications Prior to Admission medications   Medication Sig Start Date End Date Taking? Authorizing Provider  mupirocin ointment (BACTROBAN) 2 % Apply 1 application topically daily. 06/14/21   Raspet, Derry Skill, PA-C      Allergies    Patient has no known allergies.    Review of Systems   Review of Systems  Neurological:  Positive for seizures.  All other systems reviewed and are negative.   Physical Exam Updated Vital Signs BP (!) 136/90   Pulse (!) 109   Temp (!) 100.4 F (38 C)   Resp 18   SpO2 100%  Physical Exam Vitals and nursing note reviewed.  Constitutional:      Comments: Agitated and confused and unable to give history  HENT:     Head: Normocephalic.     Mouth/Throat:     Mouth: Mucous membranes are dry.  Eyes:     Extraocular Movements: Extraocular movements intact.     Pupils: Pupils are equal, round, and reactive to light.  Cardiovascular:     Rate and Rhythm: Regular rhythm. Tachycardia present.     Pulses: Normal pulses.  Pulmonary:     Effort: Pulmonary effort is normal.     Breath sounds: Normal breath sounds.  Abdominal:     General: Abdomen is flat.     Palpations:  Abdomen is soft.  Musculoskeletal:        General: Normal range of motion.     Cervical back: Normal range of motion and neck supple.  Skin:    General: Skin is warm.  Neurological:     Comments: Agitated, moving all extremities  Psychiatric:     Comments: Agitated and unable to give history     ED Results / Procedures / Treatments   Labs (all labs ordered are listed, but only abnormal results are displayed) Labs Reviewed  CBC WITH DIFFERENTIAL/PLATELET - Abnormal; Notable for the following components:      Result Value   Lymphs Abs 0.6 (*)    Monocytes Absolute 1.3 (*)    All other components within normal limits  COMPREHENSIVE METABOLIC PANEL - Abnormal; Notable for the following components:   Sodium 134 (*)    Potassium 3.2 (*)    Calcium 8.7 (*)    AST 42 (*)    Total Bilirubin 1.4 (*)    All other components within normal limits  ACETAMINOPHEN LEVEL - Abnormal; Notable for the following components:   Acetaminophen (Tylenol), Serum <10 (*)    All other components within normal limits  SALICYLATE LEVEL - Abnormal; Notable for the  following components:   Salicylate Lvl <7.0 (*)    All other components within normal limits  I-STAT ARTERIAL BLOOD GAS, ED - Abnormal; Notable for the following components:   pH, Arterial 7.336 (*)    pO2, Arterial 333 (*)    Acid-base deficit 3.0 (*)    Potassium 3.3 (*)    All other components within normal limits  CULTURE, BLOOD (ROUTINE X 2)  CULTURE, BLOOD (ROUTINE X 2)  RESP PANEL BY RT-PCR (RSV, FLU A&B, COVID)  RVPGX2  ETHANOL  LACTIC ACID, PLASMA  URINALYSIS, ROUTINE W REFLEX MICROSCOPIC  RAPID URINE DRUG SCREEN, HOSP PERFORMED  LACTIC ACID, PLASMA    EKG None  Radiology CT HEAD WO CONTRAST ( )  Result Date: 10/19/2022 CLINICAL DATA:  Seizure, new onset, no history of trauma. EXAM: CT HEAD WITHOUT CONTRAST TECHNIQUE: Contiguous axial images were obtained from the base of the skull through the vertex without intravenous  contrast. RADIATION DOSE REDUCTION: This exam was performed according to the departmental dose-optimization program which includes automated exposure control, adjustment of the mA and/or kV according to patient size and/or use of iterative reconstruction technique. COMPARISON:  None Available. FINDINGS: Brain: No acute intracranial hemorrhage, midline shift or mass effect. No extra-axial fluid collection. Gray-white matter differentiation is within normal limits. No hydrocephalus. Vascular: No hyperdense vessel or unexpected calcification. Skull: Normal. Negative for fracture or focal lesion. Sinuses/Orbits: Mild mucosal thickening in the ethmoid air cells bilaterally and maxillary sinuses. Other: Soft tissue swelling is present at the nose. There is deformity of the nasal bones bilaterally. IMPRESSION: 1. No acute intracranial process. 2. Bilateral nasal bone deformity. Correlate clinically for acute or chronic fractures. Electronically Signed   By: Thornell Sartorius M.D.   On: 10/19/2022 22:31   DG Abd Portable 1 View  Result Date: 10/19/2022 CLINICAL DATA:  Orogastric tube placement. EXAM: PORTABLE ABDOMEN - 1 VIEW COMPARISON:  None Available. FINDINGS: An orogastric tube is seen with its distal end looped within the region of the gastric fundus. A dilated small bowel loop is seen within the mid left abdomen. No radio-opaque calculi or other significant radiographic abnormality are seen. IMPRESSION: 1. Orogastric tube positioning, as described above. 2. Dilated small bowel loops within the mid left abdomen. While this may be transient in nature, further evaluation with abdomen pelvis CT is recommended if a partial small bowel obstruction is of clinical concern. Electronically Signed   By: Aram Candela M.D.   On: 10/19/2022 22:20   DG Chest Port 1 View  Result Date: 10/19/2022 CLINICAL DATA:  Endotracheal tube and orogastric tube placement. EXAM: PORTABLE CHEST 1 VIEW COMPARISON:  January 04, 2013 FINDINGS:  An endotracheal tube is seen with its distal tip approximately 5.3 cm from the carina. An enteric tube is noted with its distal end looped within the expected region of the gastric antrum. The cardiac silhouette is within limits. Low lung volumes are seen on the left with mild to moderate severity left basilar atelectasis and/or infiltrate. Mild right to left shift of the mediastinal structures is noted. There is no evidence of a pleural effusion or pneumothorax. The visualized skeletal structures are unremarkable. IMPRESSION: 1. Endotracheal tube and enteric tube positioning, as described above. 2. Mild to moderate severity left basilar atelectasis and/or infiltrate with left-sided volume loss. Electronically Signed   By: Aram Candela M.D.   On: 10/19/2022 22:19    Procedures Procedures    INTUBATION Performed by: Richardean Canal  Required items: required blood products, implants,  devices, and special equipment available Patient identity confirmed: provided demographic data and hospital-assigned identification number Time out: Immediately prior to procedure a "time out" was called to verify the correct patient, procedure, equipment, support staff and site/side marked as required.  Indications: Lethargy and hypoxia  Intubation method: Direct Laryngoscopy   Preoxygenation: BVM  Sedatives: Etomidate Paralytic: 100 mg rocuronium   Tube Size: 7.5 cuffed  Post-procedure assessment: chest rise and ETCO2 monitor Breath sounds: equal and absent over the epigastrium Tube secured with: ETT holder Chest x-ray interpreted by radiologist and me.  Chest x-ray findings: endotracheal tube in appropriate position  Patient tolerated the procedure well with no immediate complications.  CRITICAL CARE Performed by: Wandra Arthurs   Total critical care time: 40 minutes  Critical care time was exclusive of separately billable procedures and treating other patients.  Critical care was necessary to  treat or prevent imminent or life-threatening deterioration.  Critical care was time spent personally by me on the following activities: development of treatment plan with patient and/or surrogate as well as nursing, discussions with consultants, evaluation of patient's response to treatment, examination of patient, obtaining history from patient or surrogate, ordering and performing treatments and interventions, ordering and review of laboratory studies, ordering and review of radiographic studies, pulse oximetry and re-evaluation of patient's condition.   Medications Ordered in ED Medications  propofol (DIPRIVAN) 1000 MG/100ML infusion (10 mcg/kg/min  91 kg (Order-Specific) Intravenous New Bag/Given 10/19/22 2146)  fentaNYL 2561mcg in NS 262mL (5mcg/ml) infusion-PREMIX (50 mcg/hr Intravenous New Bag/Given 10/19/22 2205)  fentaNYL (SUBLIMAZE) bolus via infusion 50-100 mcg (has no administration in time range)  haloperidol lactate (HALDOL) 5 MG/ML injection (  Given 10/19/22 1815)  LORazepam (ATIVAN) 2 MG/ML injection (  Given 10/19/22 1815)  sodium chloride 0.9 % bolus 1,000 mL (0 mLs Intravenous Stopped 10/19/22 2207)  LORazepam (ATIVAN) injection 2 mg (2 mg Intramuscular Given 10/19/22 2003)  ziprasidone (GEODON) injection 20 mg (20 mg Intramuscular Given 10/19/22 2004)  diphenhydrAMINE (BENADRYL) injection 50 mg (50 mg Intramuscular Given 10/19/22 2004)  sterile water (preservative free) injection (  Given 10/19/22 2005)  ketamine (KETALAR) injection 375 mg (375 mg Intramuscular Given 10/19/22 2114)  etomidate (AMIDATE) injection 20 mg (20 mg Intravenous Given 10/19/22 2137)  rocuronium [facility]) injection 100 mg (100 mg Intravenous Given 10/19/22 2158)  acetaminophen (TYLENOL) suppository 650 mg (650 mg Rectal Given 10/19/22 2251)    ED Course/ Medical Decision Making/ A&P                             Medical Decision Making Timothy Lynch is a 27 y.o. male here presenting with seizure with  agitation.  Patient remains agitated despite given Versed prior to arrival.  Will try to give Ativan and Haldol and get labs and CT head to rule out bleed versus electrolyte abnormalities.  Will also get UDS and UA and tox.  7 pm Patient remained agitated after Ativan and Haldol.  I tried to get him to stay still for CT and x-ray and he would punch at the staff.  Will try Ativan and Geodon and Benadryl.  9 pm Patient is still agitated.  He was unable to sit still even for x-rays.  He is also not responsive.  I discussed with pharmacy and will try ketamine.  I discussed CODE STATUS with the proxy who is the sister.  She states that he is full code and if absolutely necessary,  patient can be intubated  10 pm Shortly after ketamine, patient desatted and starts gurgling.  Patient was intubated for hypoxia and inability to protect airway.  Patient's labs were unremarkable.  Serum tox is negative.  Patient has low-grade temp 100.4.  CT head did not show any bleed.  I discussed case with neurology.  They want to hold off on Keppra and wants to put patient on continuous EEG's.  Patient never had a seizure before and this is likely new onset seizure with postictal agitation. ICU to admit    Problems Addressed: Post-ictal confusion: acute illness or injury Seizure [facility]): acute illness or injury  Amount and/or Complexity of Data Reviewed Labs: ordered. Decision-making details documented in ED Course. Radiology: ordered and independent interpretation performed. Decision-making details documented in ED Course. ECG/medicine tests: ordered.  Risk OTC drugs. Prescription drug management. Decision regarding hospitalization.    Final Clinical Impression(s) / ED Diagnoses Final diagnoses:  None    Rx / DC Orders ED Discharge Orders     None         Charlynne Pander, MD 10/19/22 2313

## 2022-10-19 NOTE — ED Triage Notes (Signed)
EMS called out for new onset of seizure, patient has had 4 today, that were several minutes long.  Upon arrival patient was postictal, confused, and very combative.  He did not recognize his family or understand the situation.   EMS administered Versed, 10 mg IM, and patient arrived in restraints for staff and patient safety due to his confusion, aggressive behavior and being combative.

## 2022-10-19 NOTE — Consult Note (Signed)
NEUROLOGY CONSULTATION NOTE   Date of service: October 19, 2022 Patient Name: Timothy Lynch MRN:  [id number] DOB:  [birth year] Reason for consult: "Seizures and intubated" Requesting Provider: Drenda Freeze, MD _ _ _   _ __   _ __ _ _  __ __   _ __   __ _  History of Present Timothy Lynch is a 27 y.o. male with PMH significant for marijuana use who was in the bed this morning laying right next to his girlfriend and talking when he had sudden onset spit in his mouth, arms and legs jerking for 10 to 15 minutes and unresponsive.  He was sleepy afterwards and took about an hour to come back to himself.  He declined to come to the ED.  In the evening, he was asleep when he had a episode of all extremity stiffening that lasted 5 to 10 minutes it was brought into the ED.  He was combative after seizure and confused and very aggressive towards staff.  EMS gave him 10 mg of Versed IM.  In the ED, he got 50 of Benadryl, 6 mg of Ativan and 5 mg of Haldol.  He was still very aggressive and combative and ended up intubated after getting some ketamine.  He was started on propofol and fentanyl drip.  He got a CT head without contrast which was negative for any acute intracranial abnormalities.  Girlfriend reported the patient's sister reports that he had a seizure in 2014.  No recent head injury with loss of consciousness, no history of strokes, no history of ICH, no history of meningitis or encephalitis, he smokes marijuana and drinks about 12 ounces of wine over the weekend but no binge drinking. No family hx of seizures. Girlfriend denies any recent fever, chills.  Patient did not endorse to his girlfriend the recent signs or symptoms of a URI, UTI or gastroenteritis.  He was fine up until his seizure this morning.  He is intubated and unable to provide any meaningful history. History exclusively obtained from girlfriend at the bedside and chart review.   ROS   Unable to obtain detailed review of  systems secondary to intubation and sedation.  Past History  History reviewed. No pertinent past medical history. History reviewed. No pertinent surgical history. History reviewed. No pertinent family history. Social History   Socioeconomic History   Marital status: Single    Spouse name: Not on file   Number of children: Not on file   Years of education: Not on file   Highest education level: Not on file  Occupational History   Not on file  Tobacco Use   Smoking status: Every Day    Packs/day: 0.50    Types: Cigarettes   Smokeless tobacco: Never  Substance and Sexual Activity   Alcohol use: Yes   Drug use: Yes    Frequency: 7.0 times per week    Types: Marijuana   Sexual activity: Yes  Other Topics Concern   Not on file  Social History Narrative   Not on file   Social Determinants of Health   Financial Resource Strain: Not on file  Food Insecurity: Not on file  Transportation Needs: Not on file  Physical Activity: Not on file  Stress: Not on file  Social Connections: Not on file   No Known Allergies  Medications  (Not in a hospital admission)    Vitals   Vitals:   10/19/22 2230 10/19/22 2240 10/19/22 2245 10/19/22  2300  BP: (!) 158/119  109/72 (!) 134/98  Pulse: (!) 101  99 100  Resp: (!) 22  18 (!) 23  Temp: (!) 100.7 F (38.2 C)  (!) 100.9 F (38.3 C) (!) 100.5 F (38.1 C)  SpO2: 99% 100% 98% 96%     There is no height or weight on file to calculate BMI.  Physical Exam   General: Laying comfortably in bed; in no acute distress.  HENT: Normal oropharynx and mucosa. Normal external appearance of ears and nose.  Neck: Supple, no pain or tenderness  CV: No JVD. No peripheral edema.  Pulmonary: Symmetric Chest rise.  Breathing over the vent.   Abdomen: Soft to touch, non-tender.  Ext: No cyanosis, edema, or deformity  Skin: No rash. Normal palpation of skin.   Musculoskeletal: Normal digits and nails by inspection. No clubbing.   Neurologic  Examination  Mental status/Cognition: No response to loud voice.  To nares and to vigorous tactile stimulation, he grimaces and and lifts his arms off the bed. Speech/language: Limited by intubation.  Mute, no attempts to communicate.   Cranial nerves:   CN II Pupils equal and reactive to light, unable to assess for visual field deficit.   CN III,IV,VI EOMI intact to doll's eye.  No gaze preference, no nystagmus.   CN V Corneals are weakly positive.   CN VII Symmetric facial grimace.   CN VIII Does not turn head towards speech.   CN IX & X Cough and gag intact.   CN XI Head is midline.   CN XII Unable to assess.   Motor/sensory:  Muscle bulk: Normal, tone normal.  He will intermittently have a full body single to a few twitch. To nares stimulation, he lifts bilateral upper extremities off the bed and has some but not antigravity movement in bilateral lower extremities. No response to proximal pinch in any of the extremities.  Coordination/Complex Motor:  - Unable to assess. - Gait: Deferred for patient's safety. Labs   CBC:  Recent Labs  Lab 10/19/22 2150 10/19/22 2233  WBC 7.8  --   NEUTROABS 5.9  --   HGB 14.5 13.9  HCT 42.4 41.0  MCV 91.8  --   PLT 204  --     Basic Metabolic Panel:  Lab Results  Component Value Date   NA 137 10/19/2022   K 3.3 (L) 10/19/2022   CO2 22 10/19/2022   GLUCOSE 93 10/19/2022   BUN 9 10/19/2022   CREATININE 0.99 10/19/2022   CALCIUM 8.7 (L) 10/19/2022   GFRNONAA >60 10/19/2022   GFRAA >60 03/22/2019   Lipid Panel: No results found for: "LDLCALC" HgbA1c: No results found for: "HGBA1C" Urine Drug Screen: No results found for: "LABOPIA", "COCAINSCRNUR", "LABBENZ", "AMPHETMU", "THCU", "LABBARB"  Alcohol Level     Component Value Date/Time   ETH <10 10/19/2022 2150    CT Head without contrast(Personally reviewed): CTH was negative for a large hypodensity concerning for a large territory infarct or hyperdensity concerning for an  ICH  MRI Brain: pending  cEEG:  pending  Impression   Timothy Lynch is a 27 y.o. male with PMH significant for marijuana use who presents with 2 seizure like episodes and intubated.  Girlfriend reports he had a single seizure back in 2014.  No obvious provoking factor based on history provided.  No recent head injuries, no recent infections.  Vitals with low-grade fever, labs with no significant electrolyte abnormality, no leukocytosis.  Recommendations  - Keppra 60mg /kg  IV once. Continue propofol until he is hooked up on cEEG - plan to get cEEG(ordered) - MRI brain(ordered) - Keppra 500mg  BID since reported prior single seizure in 2014. - seizure precautions - no driving for 6 months. He has to be seizure free before he can resume driving. - urine drug screen. ______________________________________________________________________  This patient is critically ill and at significant risk of neurological worsening, death and care requires constant monitoring of vital signs, hemodynamics,respiratory and cardiac monitoring, neurological assessment, discussion with family, other specialists and medical decision making of high complexity. I spent 35 minutes of neurocritical care time  in the care of  this patient. This was time spent independent of any time provided by nurse practitioner or PA.  2015 [facility] Pager Number [phone] [phone] 10/19/2022  11:25 PM  Thank you for the opportunity to take part in the care of this patient. If you have any further questions, please contact the neurology consultation attending.  Signed,  10/21/2022 [facility] Pager Number Erick Blinks _ _ _   _ __   _ __ _ _  __ __   _ __   __ _

## 2022-10-19 NOTE — ED Notes (Signed)
Ct scan attempted but pt was agitated and was aggressive towards staff. Pt taken back to room and MD and RN made aware

## 2022-10-20 ENCOUNTER — Inpatient Hospital Stay (HOSPITAL_COMMUNITY): Payer: Self-pay

## 2022-10-20 DIAGNOSIS — R569 Unspecified convulsions: Principal | ICD-10-CM

## 2022-10-20 DIAGNOSIS — F05 Delirium due to known physiological condition: Secondary | ICD-10-CM

## 2022-10-20 LAB — CBC
HCT: 42.8 % (ref 39.0–52.0)
Hemoglobin: 14.2 g/dL (ref 13.0–17.0)
MCH: 31 pg (ref 26.0–34.0)
MCHC: 33.2 g/dL (ref 30.0–36.0)
MCV: 93.4 fL (ref 80.0–100.0)
Platelets: 197 10*3/uL (ref 150–400)
RBC: 4.58 MIL/uL (ref 4.22–5.81)
RDW: 12.3 % (ref 11.5–15.5)
WBC: 6.1 10*3/uL (ref 4.0–10.5)
nRBC: 0 % (ref 0.0–0.2)

## 2022-10-20 LAB — CBC WITH DIFFERENTIAL/PLATELET
Abs Immature Granulocytes: 0.2 10*3/uL — ABNORMAL HIGH (ref 0.00–0.07)
Basophils Absolute: 0 10*3/uL (ref 0.0–0.1)
Basophils Relative: 0 %
Eosinophils Absolute: 0 10*3/uL (ref 0.0–0.5)
Eosinophils Relative: 0 %
HCT: 41.1 % (ref 39.0–52.0)
Hemoglobin: 14 g/dL (ref 13.0–17.0)
Lymphocytes Relative: 5 %
Lymphs Abs: 1.1 10*3/uL (ref 0.7–4.0)
MCH: 32.2 pg (ref 26.0–34.0)
MCHC: 34.1 g/dL (ref 30.0–36.0)
MCV: 94.5 fL (ref 80.0–100.0)
Monocytes Absolute: 1.6 10*3/uL — ABNORMAL HIGH (ref 0.1–1.0)
Monocytes Relative: 7 %
Neutro Abs: 19.9 10*3/uL — ABNORMAL HIGH (ref 1.7–7.7)
Neutrophils Relative %: 87 %
Platelets: 169 10*3/uL (ref 150–400)
Promyelocytes Relative: 1 %
RBC: 4.35 MIL/uL (ref 4.22–5.81)
RDW: 12.2 % (ref 11.5–15.5)
WBC: 22.9 10*3/uL — ABNORMAL HIGH (ref 4.0–10.5)
nRBC: 0 % (ref 0.0–0.2)
nRBC: 0 /100 WBC

## 2022-10-20 LAB — PHOSPHORUS: Phosphorus: 4.4 mg/dL (ref 2.5–4.6)

## 2022-10-20 LAB — CSF CELL COUNT WITH DIFFERENTIAL
RBC Count, CSF: 2 /mm3 — ABNORMAL HIGH
RBC Count, CSF: 350 /mm3 — ABNORMAL HIGH
Tube #: 1
Tube #: 4
WBC, CSF: 2 /mm3 (ref 0–5)
WBC, CSF: 3 /mm3 (ref 0–5)

## 2022-10-20 LAB — MRSA NEXT GEN BY PCR, NASAL: MRSA by PCR Next Gen: NOT DETECTED

## 2022-10-20 LAB — BASIC METABOLIC PANEL
Anion gap: 12 (ref 5–15)
BUN: 10 mg/dL (ref 6–20)
CO2: 21 mmol/L — ABNORMAL LOW (ref 22–32)
Calcium: 8.2 mg/dL — ABNORMAL LOW (ref 8.9–10.3)
Chloride: 102 mmol/L (ref 98–111)
Creatinine, Ser: 1.02 mg/dL (ref 0.61–1.24)
GFR, Estimated: >60 mL/min (ref >60)
Glucose, Bld: 91 mg/dL (ref 70–99)
Potassium: 3.5 mmol/L (ref 3.5–5.1)
Sodium: 135 mmol/L (ref 135–145)

## 2022-10-20 LAB — MENINGITIS/ENCEPHALITIS PANEL (CSF)

## 2022-10-20 LAB — HIV ANTIBODY (ROUTINE TESTING W REFLEX)
HIV Screen 4th Generation wRfx: NONREACTIVE
HIV Screen 4th Generation wRfx: NONREACTIVE

## 2022-10-20 LAB — PROTEIN AND GLUCOSE, CSF
Glucose, CSF: 55 mg/dL (ref 40–70)
Total  Protein, CSF: 34 mg/dL (ref 15–45)

## 2022-10-20 LAB — PROCALCITONIN: Procalcitonin: 0.16 ng/mL

## 2022-10-20 LAB — MAGNESIUM: Magnesium: 2 mg/dL (ref 1.7–2.4)

## 2022-10-20 MED ORDER — NOREPINEPHRINE 4 MG/250ML-% IV SOLN
INTRAVENOUS | Status: AC
  Administered 2022-10-20: 2 ug/min via INTRAVENOUS
  Filled 2022-10-20: qty 250

## 2022-10-20 MED ORDER — CHLORHEXIDINE GLUCONATE CLOTH 2 % EX PADS
6.0000 | MEDICATED_PAD | Freq: Every day | CUTANEOUS | Status: DC
  Administered 2022-10-20 – 2022-10-24 (×6): 6 via TOPICAL

## 2022-10-20 MED ORDER — OSELTAMIVIR PHOSPHATE 75 MG PO CAPS
75.0000 mg | ORAL_CAPSULE | Freq: Two times a day (BID) | ORAL | Status: DC
  Administered 2022-10-20 – 2022-10-22 (×6): 75 mg
  Filled 2022-10-20 (×8): qty 1

## 2022-10-20 MED ORDER — SODIUM CHLORIDE 0.9 % IV SOLN
250.0000 mL | INTRAVENOUS | Status: DC
  Administered 2022-10-20 (×2): 250 mL via INTRAVENOUS

## 2022-10-20 MED ORDER — DEXTROSE 5 % IV SOLN
10.0000 mg/kg | Freq: Three times a day (TID) | INTRAVENOUS | Status: DC
  Administered 2022-10-20 – 2022-10-21 (×2): 845 mg via INTRAVENOUS
  Filled 2022-10-20 (×4): qty 16.9

## 2022-10-20 MED ORDER — SODIUM CHLORIDE 0.9 % IV SOLN: 2.0000 g | Freq: Two times a day (BID) | INTRAVENOUS | Status: DC

## 2022-10-20 MED ORDER — ORAL CARE MOUTH RINSE: 15.0000 mL | OROMUCOSAL | Status: DC | PRN

## 2022-10-20 MED ORDER — SODIUM CHLORIDE 0.9 % IV SOLN
2.0000 g | Freq: Two times a day (BID) | INTRAVENOUS | Status: DC
  Administered 2022-10-20 – 2022-10-21 (×2): 2 g via INTRAVENOUS
  Filled 2022-10-20 (×2): qty 20

## 2022-10-20 MED ORDER — ACETAMINOPHEN 160 MG/5ML PO SOLN
650.0000 mg | Freq: Four times a day (QID) | ORAL | Status: DC | PRN
  Administered 2022-10-20 – 2022-10-24 (×6): 650 mg
  Filled 2022-10-20 (×6): qty 20.3

## 2022-10-20 MED ORDER — PROPOFOL 1000 MG/100ML IV EMUL
5.0000 ug/kg/min | INTRAVENOUS | Status: DC
  Administered 2022-10-20 (×2): 50 ug/kg/min via INTRAVENOUS
  Administered 2022-10-20: 30 ug/kg/min via INTRAVENOUS
  Administered 2022-10-20 (×3): 50 ug/kg/min via INTRAVENOUS
  Administered 2022-10-21: 35 ug/kg/min via INTRAVENOUS
  Administered 2022-10-21 – 2022-10-22 (×7): 50 ug/kg/min via INTRAVENOUS
  Administered 2022-10-22: 45 ug/kg/min via INTRAVENOUS
  Administered 2022-10-22: 60 ug/kg/min via INTRAVENOUS
  Administered 2022-10-22: 55 ug/kg/min via INTRAVENOUS
  Administered 2022-10-22: 60 ug/kg/min via INTRAVENOUS
  Administered 2022-10-22: 30 ug/kg/min via INTRAVENOUS
  Administered 2022-10-22: 60 ug/kg/min via INTRAVENOUS
  Administered 2022-10-23 (×3): 55 ug/kg/min via INTRAVENOUS
  Filled 2022-10-20 (×13): qty 100
  Filled 2022-10-20: qty 200
  Filled 2022-10-20 (×5): qty 100
  Filled 2022-10-20: qty 200
  Filled 2022-10-20: qty 100

## 2022-10-20 MED ORDER — ACETAMINOPHEN 10 MG/ML IV SOLN
1000.0000 mg | Freq: Once | INTRAVENOUS | Status: AC
  Administered 2022-10-20: 1000 mg via INTRAVENOUS
  Filled 2022-10-20: qty 100

## 2022-10-20 MED ORDER — IBUPROFEN 200 MG PO TABS
200.0000 mg | ORAL_TABLET | Freq: Once | ORAL | Status: AC
  Administered 2022-10-20: 200 mg via NASOGASTRIC
  Filled 2022-10-20: qty 1

## 2022-10-20 MED ORDER — VANCOMYCIN HCL IN DEXTROSE 1-5 GM/200ML-% IV SOLN
1000.0000 mg | Freq: Three times a day (TID) | INTRAVENOUS | Status: DC
  Administered 2022-10-20 – 2022-10-21 (×3): 1000 mg via INTRAVENOUS
  Filled 2022-10-20 (×3): qty 200

## 2022-10-20 MED ORDER — OSELTAMIVIR PHOSPHATE 75 MG PO CAPS
75.0000 mg | ORAL_CAPSULE | Freq: Two times a day (BID) | ORAL | Status: DC
  Filled 2022-10-20: qty 1

## 2022-10-20 MED ORDER — POLYETHYLENE GLYCOL 3350 17 G PO PACK
17.0000 g | PACK | Freq: Every day | ORAL | Status: DC
  Administered 2022-10-21 – 2022-10-22 (×2): 17 g
  Filled 2022-10-20 (×2): qty 1

## 2022-10-20 MED ORDER — MIDAZOLAM HCL 2 MG/2ML IJ SOLN
1.0000 mg | INTRAMUSCULAR | Status: DC | PRN
  Administered 2022-10-20 – 2022-10-21 (×5): 2 mg via INTRAVENOUS
  Filled 2022-10-20 (×5): qty 2

## 2022-10-20 MED ORDER — DOCUSATE SODIUM 50 MG/5ML PO LIQD
100.0000 mg | Freq: Two times a day (BID) | ORAL | Status: DC
  Administered 2022-10-20 – 2022-10-22 (×6): 100 mg
  Filled 2022-10-20 (×6): qty 10

## 2022-10-20 MED ORDER — SODIUM CHLORIDE 0.9 % IV SOLN: INTRAVENOUS | Status: DC

## 2022-10-20 MED ORDER — SODIUM CHLORIDE 0.9 % IV SOLN
3.0000 g | Freq: Four times a day (QID) | INTRAVENOUS | Status: DC
  Administered 2022-10-20 (×3): 3 g via INTRAVENOUS
  Filled 2022-10-20 (×3): qty 8

## 2022-10-20 MED ORDER — FAMOTIDINE 20 MG PO TABS
20.0000 mg | ORAL_TABLET | Freq: Two times a day (BID) | ORAL | Status: DC
  Administered 2022-10-20 – 2022-10-22 (×7): 20 mg
  Filled 2022-10-20 (×7): qty 1

## 2022-10-20 MED ORDER — PANTOPRAZOLE SODIUM 40 MG IV SOLR
40.0000 mg | Freq: Every day | INTRAVENOUS | Status: DC
  Administered 2022-10-20 (×2): 40 mg via INTRAVENOUS
  Filled 2022-10-20 (×2): qty 10

## 2022-10-20 MED ORDER — NOREPINEPHRINE 4 MG/250ML-% IV SOLN
2.0000 ug/min | INTRAVENOUS | Status: DC
  Administered 2022-10-20 – 2022-10-21 (×2): 6 ug/min via INTRAVENOUS
  Administered 2022-10-22: 2 ug/min via INTRAVENOUS
  Filled 2022-10-20 (×3): qty 250

## 2022-10-20 MED ORDER — ORAL CARE MOUTH RINSE
15.0000 mL | OROMUCOSAL | Status: DC
  Administered 2022-10-20 – 2022-10-23 (×38): 15 mL via OROMUCOSAL

## 2022-10-20 MED ORDER — HEPARIN SODIUM (PORCINE) 5000 UNIT/ML IJ SOLN
5000.0000 [IU] | Freq: Three times a day (TID) | INTRAMUSCULAR | Status: DC
  Administered 2022-10-20 – 2022-10-24 (×13): 5000 [IU] via SUBCUTANEOUS
  Filled 2022-10-20 (×13): qty 1

## 2022-10-20 NOTE — Progress Notes (Signed)
Pharmacy Antibiotic Note  Timothy Lynch is a 27 y.o. male admitted on 10/19/2022 with concern for aspiration pneumonia after Sz activity.  Pharmacy has been consulted for Unasyn dosing.  Plan: Unasyn 3g IV Q6H.  Height: 6\' 3"  (190.5 cm) Weight: 91 kg (200 lb 9.9 oz) IBW/kg (Calculated) : 84.5  Temp (24hrs), Avg:100.5 F (38.1 C), Min:99.9 F (37.7 C), Max:100.9 F (38.3 C)  Recent Labs  Lab 10/19/22 2150 10/19/22 2324  WBC 7.8  --   CREATININE 0.99  --   LATICACIDVEN 0.9 0.9    Estimated Creatinine Clearance: 132.8 mL/min (by C-G formula based on SCr of 0.99 mg/dL).    No Known Allergies  Thank you for allowing pharmacy to be a part of this patient's care.  Wynona Neat, PharmD, BCPS  10/20/2022 12:33 AM

## 2022-10-20 NOTE — Progress Notes (Signed)
LTM EEG hooked up and running - no initial skin breakdown - push button tested - [facility] monitoring.  

## 2022-10-20 NOTE — Procedures (Signed)
Lumbar Puncture Procedure Note  NIXXON FARIA  [id number]  04-25-1996  Date:10/20/22  Time:6:46 PM   Provider Performing:Laquisha Northcraft   Procedure: Lumbar Puncture (27253)  Indication(s) Rule out meningitis  Consent Risks of the procedure as well as the alternatives and risks of each were explained to the patient and/or caregiver.  Consent for the procedure was obtained and is signed in the bedside chart  Anesthesia Topical only with 1% lidocaine    Time Out Verified patient identification, verified procedure, site/side was marked, verified correct patient position, special equipment/implants available, medications/allergies/relevant history reviewed, required imaging and test results available.   Sterile Technique Maximal sterile technique including sterile barrier drape, hand hygiene, sterile gown, sterile gloves, mask, hair covering.    Procedure Description Using palpation, approximate location of L3-L4 space identified.   Lidocaine used to anesthetize skin and subcutaneous tissue overlying this area.  A 20g spinal needle was then used to access the subarachnoid space. Opening pressure: 27 cm H2O. Closing pressure:Not obtained. Clear CSF obtained.  Complications/Tolerance None; patient tolerated the procedure well.   EBL Minimal   Specimen(s) CSF

## 2022-10-20 NOTE — Progress Notes (Signed)
Neurology progress note  S: Remains intubated and sedated. Increasingly febrile over the course of the day to 103.7 this afternoon. LP performed, results pending.  cEEG 1/21 overnight: continuous generalized slowing  MRI brain wo contrast: normal (personal review)  Exam  Vitals:   10/20/22 1545 10/20/22 1600  BP: 96/64 93/63  Pulse: (!) 105 (!) 104  Resp: 18 18  Temp:  (!) 102.6 F (39.2 C)  SpO2: 91% 94%   General: Laying comfortably in bed; in no acute distress.  HENT: Normal oropharynx and mucosa. Normal external appearance of ears and nose.  Neck: Supple, no pain or tenderness  CV: No JVD. No peripheral edema.  Pulmonary: Symmetric Chest rise.  Breathing over the vent.   Abdomen: Soft to touch, non-tender.  Ext: No cyanosis, edema, or deformity  Skin: No rash. Normal palpation of skin.   Musculoskeletal: Normal digits and nails by inspection. No clubbing.    Neurologic Examination  Mental status/Cognition: No response to loud voice.  To nares and to vigorous tactile stimulation, he grimaces and and lifts his arms off the bed. Speech/language: Limited by intubation.  Mute, no attempts to communicate.   Cranial nerves:   CN II Pupils equal and reactive to light, unable to assess for visual field deficit.   CN III,IV,VI EOMI intact to doll's eye.  No gaze preference, no nystagmus.   CN V Corneals are weakly positive.   CN VII Symmetric facial grimace.   CN VIII Does not turn head towards speech.   CN IX & X Cough and gag intact.   CN XI Head is midline.   CN XII Unable to assess.    Motor/sensory:  Muscle bulk: Normal, tone normal.  He will intermittently have Timothy full body single to Timothy few twitch. To nares stimulation, he lifts bilateral upper extremities off the bed and has some but not antigravity movement in bilateral lower extremities. No response to proximal pinch in any of the extremities.   Coordination/Complex Motor:  - Unable to assess. - Gait: Deferred for  patient's safety.  CT Head without contrast(Personally reviewed): CTH was negative for Timothy large hypodensity concerning for Timothy large territory infarct or hyperdensity concerning for an ICH  Timothy/P:   AVYUKT Lynch is Timothy 27 y.o. male with PMH significant for marijuana use who presents with 2 seizure like episodes and intubated.  Girlfriend reports he had Timothy single seizure back in 2014.  MRI brain without contrast was normal and continuous EEG overnight only showed continuous generalized slowing with no epileptiform abnormalities.  Over the course of the day he has become increasingly febrile up to 103.7 this afternoon raising concern for meningitis.  He was started on empiric coverage with Vanco/ceftriaxone/acyclovir.  LP was performed and results are pending.  - LP performed, results pending - Empiric CNS coverage with vanc/ceftriaxone/acyclovir - Continue cEEG - Continue keppra 500mg  bid (patient reported single prior seizure in 2014) - Seizure precautions - no driving for 6 months. He has to be seizure free before he can resume driving. - urine drug screen  This patient is critically ill and at significant risk of neurological worsening, death and care requires constant monitoring of vital signs, hemodynamics,respiratory and cardiac monitoring, neurological assessment, discussion with family, other specialists and medical decision making of high complexity. I spent 35 minutes of neurocritical care time  in the care of  this patient. This was time spent independent of any time provided by nurse practitioner or PA.  Su Monks, MD  [facility] [phone]  If 7pm- 7am, please page neurology on call as listed in [place].

## 2022-10-20 NOTE — H&P (Addendum)
NAME:  READ BONELLI, MRN:  [id number], DOB:  Feb 03, 1996, LOS: 0 ADMISSION DATE:  10/19/2022 CONSULTATION DATE:  10/20/2022 REFERRING MD:  Darl Householder - EDP CHIEF COMPLAINT:  Seizure   History of Present Illness:  27 year old man who presented to [facility] ED via EMS after seizure x 2. No significant PMHx; remote history of seizure x 1 in 2014 (per girlfriend at bedside), remote history of asthma, marijuana use.  History is obtained from chart and patient's girlfriend (at bedside). States patient first had a seizure morning of admission (1/20) around 0730 without any prodromal symptoms. No recent c/o fever/chills/CP/SOB, no dizziness or HA. Patient drinks socially but no heavy drinking or daily alcohol use. Patient does use marijuana. Patient subsequently had 2 seizures throughout the course of the day, each lasting several minutes. EMS was called after patient appeared postictal and confused. On ED arrival, agitated and combative and not redirectable. Versed 10mg  IM administered by EMS and restraints utilized. Attempted CT Head with Ativan and Haldol but patient did not tolerate this due to ongoing agitation and aggression toward staff. Geodon, Ativan and Benadryl administered by EDP. Ketamine initiated. Due to need for increased sedation and airway protection, patient was intubated in ED.   In ED, mildly febrile to 100.58F, tachycardic to 100s, normotensive, SpO2 100%. Labs demonstrated CBC WNL, Na 134, K 3.2, CO2 22, Cr 0.99, Ca 8.7. AST/ALT/AP WNL, Tbili mildly elevated to 1.4. LA 0.9. APAP/salicylates negative and UDS positive for benzos/THC. Ethanol negative. UA with small Hgb, ketones, protein. Flu B positive, COVID/RSV negative. BCx pending. CXR with mild to moderate L basilar atelectasis vs infiltrate; CT Head NAICA, chronic nasal bone fx. Neuro consulted for seizures.  PCCM consulted for ICU admission.  Pertinent Medical History:  Remote history of seizure x 1 (2014), remote history of asthma, marijuana  use  Significant Hospital Events: Including procedures, antibiotic start and stop dates in addition to other pertinent events   1/20 - BIB EMS for seizures x 2 at home. LG fever to 100.58F, no other symptoms. Flu B+. Agitated/aggressive/not redirectable in ED requiring increased sedation; intubated for airway protection.  CT Head NAICA. Neuro consulted. PCCM consulted for ICU admission.  Interim History / Subjective:  PCCM consulted for ICU admission.  Objective:  Blood pressure (!) 121/104, pulse (!) 102, temperature 100.1 F (37.8 C), resp. rate (!) 23, height 6\' 3"  (1.905 m), weight 91 kg, SpO2 93 %.    Vent Mode: PRVC FiO2 (%):  [40 %-100 %] 40 % Set Rate:  [18 bmp] 18 bmp Vt Set:  [670 mL] 670 mL PEEP:  [5 cmH20] 5 cmH20 Plateau Pressure:  [16 cmH20-18 cmH20] 16 cmH20   Intake/Output Summary (Last 24 hours) at 10/20/2022 0124 Last data filed at 10/20/2022 0025 Gross per 24 hour  Intake 1000 ml  Output 100 ml  Net 900 ml   Filed Weights   10/19/22 2348  Weight: 91 kg   Physical Examination: General: Acutely ill-appearing young man in NAD. Mildly agitated on vent, coughing frequently. HEENT: [place]/AT, anicteric sclera, pupils equal round 30mm sluggish, moist mucous membranes, increased thin oral secretions. Neuro: Sedated, intermittently agitated and reaching for ETT with RUE. Withdraws to pain in all 4 extremities. Not following commands. Moves all 4 extremities spontaneously.  +Cough and +Gag  CV: RRR, no m/g/r. PULM: Breathing even and unlabored on vent (PEEP 5, FiO2 40%). Lung fields with coarse rhonchi bilaterally, R > L. GI: Soft, nontender, nondistended. Normoactive bowel sounds. Extremities: No LE edema noted.  Skin: Warm/dry, no rashes.  Resolved Hospital Problem List:    Assessment & Plan:  Seizure of unknown etiology Presented to [facility] ED via EMS for seizure x 2 at home. Per girlfriend at bedside, remote history of 1 seizure ~2014. Not on any AEDs at home, does not  follow with Neurology. No symptoms other than fevers at home. - Admit to ICU for close monitoring - Neuro consulted, appreciate recommendations - AEDs per Neuro (Keppra, prop, Versed PRN) - LTM EEG - Obtain MRI Brain - Obtain Echo - Seizure precautions - Neuroprotective measures: HOB > 30 degrees, normoglycemia, normothermia, electrolytes WNL - Consider LP if persistent AMS, worsening fever or WBC increase  Acute hypoxemic respiratory failure in the setting of influenza B and sedation for seizure activity Remote history of asthma - Continue full vent support (4-8cc/kg IBW) - Wean FiO2 for O2 sat > 90% - Daily WUA/SBT as sedation weaning/mental status tolerates - VAP bundle - Bronchodilators PRN - Pulmonary hygiene - PAD protocol for sedation: Propofol and Fentanyl for goal RASS -1 to -2  Influenza B infection Possible aspiration PNA Tested positive for Flu B on RVP 1/20. - Tamiflu x 5 days - Unasyn for possible aspiration coverage (?aspiration during seizure) - Follow CXR - F/u PCT, Resp Cx - Narrow/discontinue antibiotics as appropriate  Best Practice: (right click and "Reselect all SmartList Selections" daily)   Diet/type: NPO w/ meds via tube DVT prophylaxis: SCDs, SQH GI prophylaxis: PPI Lines: N/A Foley:  N/A Code Status:  full code Last date of multidisciplinary goals of care discussion [Pending]  Labs:  CBC: Recent Labs  Lab 10/19/22 2150 10/19/22 2233  WBC 7.8  --   NEUTROABS 5.9  --   HGB 14.5 13.9  HCT 42.4 41.0  MCV 91.8  --   PLT 204  --    Basic Metabolic Panel: Recent Labs  Lab 10/19/22 2150 10/19/22 2233  NA 134* 137  K 3.2* 3.3*  CL 100  --   CO2 22  --   GLUCOSE 93  --   BUN 9  --   CREATININE 0.99  --   CALCIUM 8.7*  --    GFR: Estimated Creatinine Clearance: 132.8 mL/min (by C-G formula based on SCr of 0.99 mg/dL). Recent Labs  Lab 10/19/22 2150 10/19/22 2324  WBC 7.8  --   LATICACIDVEN 0.9 0.9   Liver Function  Tests: Recent Labs  Lab 10/19/22 2150  AST 42*  ALT 18  ALKPHOS 65  BILITOT 1.4*  PROT 7.5  ALBUMIN 4.1   No results for input(s): "LIPASE", "AMYLASE" in the last 168 hours. No results for input(s): "AMMONIA" in the last 168 hours.  ABG:    Component Value Date/Time   PHART 7.336 (L) 10/19/2022 2233   PCO2ART 41.3 10/19/2022 2233   PO2ART 333 (H) 10/19/2022 2233   HCO3 21.8 10/19/2022 2233   TCO2 23 10/19/2022 2233   ACIDBASEDEF 3.0 (H) 10/19/2022 2233   O2SAT 100 10/19/2022 2233    Coagulation Profile: No results for input(s): "INR", "PROTIME" in the last 168 hours.  Cardiac Enzymes: No results for input(s): "CKTOTAL", "CKMB", "CKMBINDEX", "TROPONINI" in the last 168 hours.  HbA1C: No results found for: "HGBA1C"  CBG: No results for input(s): "GLUCAP" in the last 168 hours.  Review of Systems:   Patient is encephalopathic and/or intubated. Therefore history has been obtained from chart review.   Past Medical History:  He,  has no past medical history on file.   Surgical History:  History reviewed. No pertinent surgical history.   Social History:   reports that he has been smoking cigarettes. He has been smoking an average of .5 packs per day. He has never used smokeless tobacco. He reports current alcohol use. He reports current drug use. Frequency: 7.00 times per week. Drug: Marijuana.   Family History:  His family history is not on file.   Allergies: No Known Allergies   Home Medications: Prior to Admission medications   Not on File    Critical care time: 42 minutes   Lestine Mount, PA-C [facility] 10/20/22 1:24 AM  Please see [email or web address] for pager details.  From 7A-7P if no response, please call [phone] After hours, please call ELink [phone]

## 2022-10-20 NOTE — Progress Notes (Signed)
Pharmacy Antibiotic Note  Timothy Lynch is a 27 y.o. male admitted on 10/19/2022 with  R/o meningitis .  Pharmacy has been consulted for vanc/acyclovir dosing.  Pt is being r/o for bacterial and HSV meningitis. LP is being done today. We will try to start abx after LP is done  Scr 1, wbc wnl  Plan: LP then Vanc 1g IV q8 (Trough 15-20) Ceftriaxone 2g IV q12 Acyclovir 845mg  IV q8 NS 158ml/hr  Height: 6\' 3"  (190.5 cm) Weight: 90.3 kg (199 lb 1.2 oz) IBW/kg (Calculated) : 84.5  Temp (24hrs), Avg:100.8 F (38.2 C), Min:99.1 F (37.3 C), Max:104 F (40 C)  Recent Labs  Lab 10/19/22 2150 10/19/22 2324 10/20/22 0120  WBC 7.8  --  6.1  CREATININE 0.99  --  1.02  LATICACIDVEN 0.9 0.9  --     Estimated Creatinine Clearance: 128.9 mL/min (by C-G formula based on SCr of 1.02 mg/dL).    No Known Allergies  Antimicrobials this admission: 1/21 vanc>> 1/21 ceftriaxone>> 1/21 acyclovir>>  Dose adjustments this admission:   Microbiology results: 1/20 blood>>ngtd 1/21 CSF MRSA neg  Onnie Boer, PharmD, BCIDP, AAHIVP, CPP Infectious Disease Pharmacist 10/20/2022 5:36 PM

## 2022-10-20 NOTE — Progress Notes (Signed)
Pt transported from 4N17 to MRI with no complications. Upon transferring pt from ICU bed to MRI bed pt started to get combative and tried to pull the ETT tube a couple of times. RN gave more sedation which calmed him down some, but it also lowered the pts BP to 88/54. Decision was made to not proceed with MRI and to head back to the ICU. Pt was transported from MRI back to 4N17.

## 2022-10-20 NOTE — Progress Notes (Signed)
An USGPIV (ultrasound guided PIV) has been placed for short-term vasopressor infusion. A correctly placed ivWatch must be used when administering Vasopressors. Should this treatment be needed beyond 72 hours, central line access should be obtained.  It will be the responsibility of the bedside nurse to follow best practice to prevent extravasations.

## 2022-10-20 NOTE — Progress Notes (Signed)
Attempted Echocardiogram again at 2:48pm, patient is still at MRI. Will attempt Echocardiogram at a later time.

## 2022-10-20 NOTE — ED Notes (Signed)
ED TO INPATIENT HANDOFF REPORT  ED Nurse Name and Phone #: Ellyn Rubiano  S Name/Age/Gender Timothy Lynch 27 y.o. male Room/Bed: 028C/028C  Code Status   Code Status: Full Code  Home/SNF/Other Home Patient oriented to: self Is this baseline? No   Triage Complete: Triage complete  Chief Complaint Seizure [facility]) [R56.9]  Triage Note EMS called out for new onset of seizure, patient has had 4 today, that were several minutes long.  Upon arrival patient was postictal, confused, and very combative.  He did not recognize his family or understand the situation.   EMS administered Versed, 10 mg IM, and patient arrived in restraints for staff and patient safety due to his confusion, aggressive behavior and being combative.    Allergies No Known Allergies  Level of Care/Admitting Diagnosis ED Disposition     ED Disposition  Admit   Condition  --   Comment  Hospital Area: [place] [[id number]]  Level of Care: ICU [6]  May admit patient to [facility] or [facility] if equivalent level of care is available:: No  Covid Evaluation: Confirmed COVID Negative  Diagnosis: Seizure [facility]) [[id number]]  Admitting Physician: Collier Bullock [[id number]]  Attending Physician: Collier Bullock [[id number]]  Certification:: I certify this patient will need inpatient services for at least 2 midnights  Estimated Length of Stay: 4          B Medical/Surgery History History reviewed. No pertinent past medical history. History reviewed. No pertinent surgical history.   A IV Location/Drains/Wounds Patient Lines/Drains/Airways Status     Active Line/Drains/Airways     Name Placement date Placement time Site Days   Peripheral IV 03/22/19 Right Antecubital 03/22/19  0630  Antecubital  1308   Peripheral IV 10/05/20 Left Antecubital 10/05/20  1054  Antecubital  745   Airway 7.5 mm 10/19/22  2138  -- 1            Intake/Output Last 24 hours  Intake/Output Summary (Last 24  hours) at 10/20/2022 0022 Last data filed at 10/19/2022 2207 Gross per 24 hour  Intake 1000 ml  Output --  Net 1000 ml    Labs/Imaging Results for orders placed or performed during the hospital encounter of 10/19/22 (from the past 48 hour(s))  CBC with Differential     Status: Abnormal   Collection Time: 10/19/22  9:50 PM  Result Value Ref Range   WBC 7.8 4.0 - 10.5 K/uL   RBC 4.62 4.22 - 5.81 MIL/uL   Hemoglobin 14.5 13.0 - 17.0 g/dL   HCT 42.4 39.0 - 52.0 %   MCV 91.8 80.0 - 100.0 fL   MCH 31.4 26.0 - 34.0 pg   MCHC 34.2 30.0 - 36.0 g/dL   RDW 12.2 11.5 - 15.5 %   Platelets 204 150 - 400 K/uL   nRBC 0.0 0.0 - 0.2 %   Neutrophils Relative % 75 %   Neutro Abs 5.9 1.7 - 7.7 K/uL   Lymphocytes Relative 8 %   Lymphs Abs 0.6 (L) 0.7 - 4.0 K/uL   Monocytes Relative 16 %   Monocytes Absolute 1.3 (H) 0.1 - 1.0 K/uL   Eosinophils Relative 0 %   Eosinophils Absolute 0.0 0.0 - 0.5 K/uL   Basophils Relative 0 %   Basophils Absolute 0.0 0.0 - 0.1 K/uL   Immature Granulocytes 1 %   Abs Immature Granulocytes 0.04 0.00 - 0.07 K/uL    Comment: Performed at [facility], [address], [place], [place]  [address]  Comprehensive metabolic panel     Status: Abnormal   Collection Time: 10/19/22  9:50 PM  Result Value Ref Range   Sodium 134 (L) 135 - 145 mmol/L   Potassium 3.2 (L) 3.5 - 5.1 mmol/L   Chloride 100 98 - 111 mmol/L   CO2 22 22 - 32 mmol/L   Glucose, Bld 93 70 - 99 mg/dL    Comment: Glucose reference range applies only to samples taken after fasting for at least 8 hours.   BUN 9 6 - 20 mg/dL   Creatinine, Ser 3.41 0.61 - 1.24 mg/dL   Calcium 8.7 (L) 8.9 - 10.3 mg/dL   Total Protein 7.5 6.5 - 8.1 g/dL   Albumin 4.1 3.5 - 5.0 g/dL   AST 42 (H) 15 - 41 U/L   ALT 18 0 - 44 U/L   Alkaline Phosphatase 65 38 - 126 U/L   Total Bilirubin 1.4 (H) 0.3 - 1.2 mg/dL   GFR, Estimated >93 >79 mL/min    Comment: (NOTE) Calculated using the CKD-EPI Creatinine Equation (2021)     Anion gap 12 5 - 15    Comment: Performed at [facility], [address] [place], [place] [address]  Ethanol     Status: None   Collection Time: 10/19/22  9:50 PM  Result Value Ref Range   Alcohol, Ethyl (B) <10 <10 mg/dL    Comment: (NOTE) Lowest detectable limit for serum alcohol is 10 mg/dL.  For medical purposes only. Performed at [facility], [address], [place], [place] [address]   Acetaminophen level     Status: Abnormal   Collection Time: 10/19/22  9:50 PM  Result Value Ref Range   Acetaminophen (Tylenol), Serum <10 (L) 10 - 30 ug/mL    Comment: (NOTE) Therapeutic concentrations vary significantly. A range of 10-30 ug/mL  may be an effective concentration for many patients. However, some  are best treated at concentrations outside of this range. Acetaminophen concentrations >150 ug/mL at 4 hours after ingestion  and >50 ug/mL at 12 hours after ingestion are often associated with  toxic reactions.  Performed at [facility], [address], [place], [place] [address]   Salicylate level     Status: Abnormal   Collection Time: 10/19/22  9:50 PM  Result Value Ref Range   Salicylate Lvl <7.0 (L) 7.0 - 30.0 mg/dL    Comment: Performed at [facility], [address], [place], [place] [address]  Lactic acid, plasma     Status: None   Collection Time: 10/19/22  9:50 PM  Result Value Ref Range   Lactic Acid, Venous 0.9 0.5 - 1.9 mmol/L    Comment: Performed at [facility], [address] [place], [place] [address]  Resp panel by RT-PCR (RSV, Flu A&B, Covid) Anterior Nasal Swab     Status: Abnormal   Collection Time: 10/19/22  9:54 PM   Specimen: Anterior Nasal Swab  Result Value Ref Range   SARS Coronavirus 2 by RT PCR NEGATIVE NEGATIVE    Comment: (NOTE) SARS-CoV-2 target nucleic acids are NOT DETECTED.  The SARS-CoV-2 RNA is generally detectable in upper respiratory specimens during the acute phase of infection. The  lowest concentration of SARS-CoV-2 viral copies this assay can detect is 138 copies/mL. A negative result does not preclude SARS-Cov-2 infection and should not be used as the sole basis for treatment or other patient management decisions. A negative result may occur  with  improper specimen collection/handling, submission of specimen other than nasopharyngeal swab, presence of viral mutation(s) within the areas targeted by this assay, and inadequate number of viral copies(<138 copies/mL). A negative result must be combined with clinical observations, patient history, and epidemiological information. The expected result is Negative.  Fact Sheet for Patients:  BloggerCourse.com  Fact Sheet for Healthcare Providers:  SeriousBroker.it  This test is no t yet approved or cleared by the [place] FDA and  has been authorized for detection and/or diagnosis of SARS-CoV-2 by FDA under an Emergency Use Authorization (EUA). This EUA will remain  in effect (meaning this test can be used) for the duration of the COVID-19 declaration under Section 564(b)(1) of the Act, 21 U.S.C.section 360bbb-3(b)(1), unless the authorization is terminated  or revoked sooner.       Influenza A by PCR NEGATIVE NEGATIVE   Influenza B by PCR POSITIVE (A) NEGATIVE    Comment: (NOTE) The Xpert Xpress SARS-CoV-2/FLU/RSV plus assay is intended as an aid in the diagnosis of influenza from Nasopharyngeal swab specimens and should not be used as a sole basis for treatment. Nasal washings and aspirates are unacceptable for Xpert Xpress SARS-CoV-2/FLU/RSV testing.  Fact Sheet for Patients: BloggerCourse.com  Fact Sheet for Healthcare Providers: SeriousBroker.it  This test is not yet approved or cleared by the [place] FDA and has been authorized for detection and/or diagnosis of SARS-CoV-2 by FDA under an  Emergency Use Authorization (EUA). This EUA will remain in effect (meaning this test can be used) for the duration of the COVID-19 declaration under Section 564(b)(1) of the Act, 21 U.S.C. section 360bbb-3(b)(1), unless the authorization is terminated or revoked.     Resp Syncytial Virus by PCR NEGATIVE NEGATIVE    Comment: (NOTE) Fact Sheet for Patients: BloggerCourse.com  Fact Sheet for Healthcare Providers: SeriousBroker.it  This test is not yet approved or cleared by the [place] FDA and has been authorized for detection and/or diagnosis of SARS-CoV-2 by FDA under an Emergency Use Authorization (EUA). This EUA will remain in effect (meaning this test can be used) for the duration of the COVID-19 declaration under Section 564(b)(1) of the Act, 21 U.S.C. section 360bbb-3(b)(1), unless the authorization is terminated or revoked.  Performed at [facility], [address], [place], [place] [address]   I-Stat arterial blood gas, ED     Status: Abnormal   Collection Time: 10/19/22 10:33 PM  Result Value Ref Range   pH, Arterial 7.336 (L) 7.35 - 7.45   pCO2 arterial 41.3 32 - 48 mmHg   pO2, Arterial 333 (H) 83 - 108 mmHg   Bicarbonate 21.8 20.0 - 28.0 mmol/L   TCO2 23 22 - 32 mmol/L   O2 Saturation 100 %   Acid-base deficit 3.0 (H) 0.0 - 2.0 mmol/L   Sodium 137 135 - 145 mmol/L   Potassium 3.3 (L) 3.5 - 5.1 mmol/L   Calcium, Ion 1.16 1.15 - 1.40 mmol/L   HCT 41.0 39.0 - 52.0 %   Hemoglobin 13.9 13.0 - 17.0 g/dL   Patient temperature 562.1 F    Collection site RADIAL, ALLEN'S TEST ACCEPTABLE    Drawn by RT    Sample type ARTERIAL   Urinalysis, Routine w reflex microscopic     Status: Abnormal   Collection Time: 10/19/22 10:57 PM  Result Value Ref Range   Color, Urine YELLOW YELLOW   APPearance CLOUDY (A) CLEAR   Specific Gravity, Urine 1.019 1.005 - 1.030   pH 5.0  5.0 - 8.0   Glucose, UA NEGATIVE NEGATIVE  mg/dL   Hgb urine dipstick SMALL (A) NEGATIVE   Bilirubin Urine NEGATIVE NEGATIVE   Ketones, ur 20 (A) NEGATIVE mg/dL   Protein, ur 100 (A) NEGATIVE mg/dL   Nitrite NEGATIVE NEGATIVE   Leukocytes,Ua NEGATIVE NEGATIVE   RBC / HPF 0-5 0 - 5 RBC/hpf   WBC, UA 0-5 0 - 5 WBC/hpf   Bacteria, UA RARE (A) NONE SEEN   Squamous Epithelial / HPF 0-5 0 - 5 /HPF   Mucus PRESENT    Amorphous Crystal PRESENT     Comment: Performed at [facility], [place] [address], [place], [place] [address]  Rapid urine drug screen (hospital performed)     Status: Abnormal   Collection Time: 10/19/22 10:57 PM  Result Value Ref Range   Opiates NONE DETECTED NONE DETECTED   Cocaine NONE DETECTED NONE DETECTED   Benzodiazepines POSITIVE (A) NONE DETECTED   Amphetamines NONE DETECTED NONE DETECTED   Tetrahydrocannabinol POSITIVE (A) NONE DETECTED   Barbiturates NONE DETECTED NONE DETECTED    Comment: (NOTE) DRUG SCREEN FOR MEDICAL PURPOSES ONLY.  IF CONFIRMATION IS NEEDED FOR ANY PURPOSE, NOTIFY LAB WITHIN 5 DAYS.  LOWEST DETECTABLE LIMITS FOR URINE DRUG SCREEN Drug Class                     Cutoff (ng/mL) Amphetamine and metabolites    1000 Barbiturate and metabolites    200 Benzodiazepine                 200 Opiates and metabolites        300 Cocaine and metabolites        300 THC                            50 Performed at [facility], [place] [address], [place], [place] [address]   Lactic acid, plasma     Status: None   Collection Time: 10/19/22 11:24 PM  Result Value Ref Range   Lactic Acid, Venous 0.9 0.5 - 1.9 mmol/L    Comment: Performed at [facility] [address], [place], [place] [address]   CT HEAD WO CONTRAST (5MM)  Result Date: 10/19/2022 CLINICAL DATA:  Seizure, new onset, no history of trauma. EXAM: CT HEAD WITHOUT CONTRAST TECHNIQUE: Contiguous axial images were obtained from the base of the skull through the vertex without intravenous contrast. RADIATION DOSE  REDUCTION: This exam was performed according to the departmental dose-optimization program which includes automated exposure control, adjustment of the mA and/or kV according to patient size and/or use of iterative reconstruction technique. COMPARISON:  None Available. FINDINGS: Brain: No acute intracranial hemorrhage, midline shift or mass effect. No extra-axial fluid collection. Gray-white matter differentiation is within normal limits. No hydrocephalus. Vascular: No hyperdense vessel or unexpected calcification. Skull: Normal. Negative for fracture or focal lesion. Sinuses/Orbits: Mild mucosal thickening in the ethmoid air cells bilaterally and maxillary sinuses. Other: Soft tissue swelling is present at the nose. There is deformity of the nasal bones bilaterally. IMPRESSION: 1. No acute intracranial process. 2. Bilateral nasal bone deformity. Correlate clinically for acute or chronic fractures. Electronically Signed   By: Brett Fairy M.D.   On: 10/19/2022 22:31   DG Abd Portable 1 View  Result Date: 10/19/2022 CLINICAL DATA:  Orogastric tube placement. EXAM: PORTABLE ABDOMEN - 1 VIEW COMPARISON:  None Available. FINDINGS: An orogastric  tube is seen with its distal end looped within the region of the gastric fundus. A dilated small bowel loop is seen within the mid left abdomen. No radio-opaque calculi or other significant radiographic abnormality are seen. IMPRESSION: 1. Orogastric tube positioning, as described above. 2. Dilated small bowel loops within the mid left abdomen. While this may be transient in nature, further evaluation with abdomen pelvis CT is recommended if a partial small bowel obstruction is of clinical concern. Electronically Signed   By: Aram Candelahaddeus  Houston M.D.   On: 10/19/2022 22:20   DG Chest Port 1 View  Result Date: 10/19/2022 CLINICAL DATA:  Endotracheal tube and orogastric tube placement. EXAM: PORTABLE CHEST 1 VIEW COMPARISON:  January 04, 2013 FINDINGS: An endotracheal tube is  seen with its distal tip approximately 5.3 cm from the carina. An enteric tube is noted with its distal end looped within the expected region of the gastric antrum. The cardiac silhouette is within limits. Low lung volumes are seen on the left with mild to moderate severity left basilar atelectasis and/or infiltrate. Mild right to left shift of the mediastinal structures is noted. There is no evidence of a pleural effusion or pneumothorax. The visualized skeletal structures are unremarkable. IMPRESSION: 1. Endotracheal tube and enteric tube positioning, as described above. 2. Mild to moderate severity left basilar atelectasis and/or infiltrate with left-sided volume loss. Electronically Signed   By: Aram Candelahaddeus  Houston M.D.   On: 10/19/2022 22:19    Pending Labs Unresulted Labs (From admission, onward)     Start     Ordered   10/21/22 0500  Triglycerides  (propofol (DIPRIVAN))  Every 72 hours,   R     Comments: While on propofol (DIPRIVAN)    10/20/22 0007   10/20/22 0500  CBC  Tomorrow morning,   R        10/20/22 0006   10/20/22 0500  Basic metabolic panel  Tomorrow morning,   R        10/20/22 0006   10/20/22 0500  Magnesium  Tomorrow morning,   R        10/20/22 0006   10/20/22 0500  Phosphorus  Tomorrow morning,   R        10/20/22 0006   10/20/22 0006  Procalcitonin  Once,   R        10/20/22 0006   10/20/22 0006  Culture, Respiratory w Gram Stain (tracheal aspirate)  Once,   R        10/20/22 0006   10/20/22 0001  HIV Antibody (routine testing w rflx)  (HIV Antibody (Routine testing w reflex) panel)  Once,   R        10/20/22 0006   10/19/22 1921  Blood culture (routine x 2)  BLOOD CULTURE X 2,   R      10/19/22 1920            Vitals/Pain Today's Vitals   10/19/22 2315 10/19/22 2330 10/19/22 2345 10/19/22 2348  BP:  93/60 (!) 121/104   Pulse: 96 96 (!) 102   Resp: 18 18 (!) 23   Temp: (!) 100.8 F (38.2 C) (!) 100.9 F (38.3 C) 100.1 F (37.8 C)   SpO2: 95% 93% 93%    Weight:    91 kg  Height:    6\' 3"  (1.905 m)    Isolation Precautions No active isolations  Medications Medications  fentaNYL 2500mcg in NS 250mL (4710mcg/ml) infusion-PREMIX (50 mcg/hr Intravenous New Bag/Given 10/19/22 2205)  fentaNYL (SUBLIMAZE) bolus via infusion 50-100 mcg (has no administration in time range)  levETIRAcetam (KEPPRA) tablet 500 mg (has no administration in time range)    Or  levETIRAcetam (KEPPRA) IVPB 500 mg/100 mL premix (has no administration in time range)  heparin injection 5,000 Units (has no administration in time range)  famotidine (PEPCID) tablet 20 mg (has no administration in time range)  pantoprazole (PROTONIX) injection 40 mg (has no administration in time range)  docusate (COLACE) 50 MG/5ML liquid 100 mg (has no administration in time range)  polyethylene glycol (MIRALAX / GLYCOLAX) packet 17 g (has no administration in time range)  propofol (DIPRIVAN) 1000 MG/100ML infusion (has no administration in time range)  midazolam (VERSED) injection 1-2 mg (has no administration in time range)  oseltamivir (TAMIFLU) capsule 75 mg (has no administration in time range)  haloperidol lactate (HALDOL) 5 MG/ML injection (  Given 10/19/22 1815)  LORazepam (ATIVAN) 2 MG/ML injection (  Given 10/19/22 1815)  sodium chloride 0.9 % bolus 1,000 mL (0 mLs Intravenous Stopped 10/19/22 2207)  LORazepam (ATIVAN) injection 2 mg (2 mg Intramuscular Given 10/19/22 2003)  ziprasidone (GEODON) injection 20 mg (20 mg Intramuscular Given 10/19/22 2004)  diphenhydrAMINE (BENADRYL) injection 50 mg (50 mg Intramuscular Given 10/19/22 2004)  sterile water (preservative free) injection (  Given 10/19/22 2005)  ketamine (KETALAR) injection 375 mg (375 mg Intramuscular Given 10/19/22 2114)  etomidate (AMIDATE) injection 20 mg (20 mg Intravenous Given 10/19/22 2137)  rocuronium (ZEMURON) injection 100 mg (100 mg Intravenous Given 10/19/22 2158)  acetaminophen (TYLENOL) suppository 650 mg (650 mg  Rectal Given 10/19/22 2251)  levETIRAcetam (KEPPRA) IVPB 1000 mg/100 mL premix (0 mg Intravenous Stopped 10/20/22 0013)    Mobility non-ambulatory     Focused Assessments Neuro Assessment Handoff:  Swallow screen pass? No          Neuro Assessment:   Neuro Checks:      Has TPA been given? No If patient is a Neuro Trauma and patient is going to OR before floor call report to 4N Charge nurse: [phone] or [phone]   R Recommendations: See Admitting Provider Note  Report given to:   Additional Notes:

## 2022-10-20 NOTE — Progress Notes (Signed)
Echocardiogram attempted, patient is in MRI.

## 2022-10-20 NOTE — Progress Notes (Signed)
An USGPIV (ultrasound guided PIV) has been placed for short-term vasopressor infusion. A correctly placed ivWatch must be used when administering Vasopressors. Should this treatment be needed beyond 72 hours, central line access should be obtained.  It will be the responsibility of the bedside nurse to follow best practice to prevent extravasations.   ?

## 2022-10-20 NOTE — Procedures (Addendum)
Patient Name: Timothy Lynch  MRN: [id number]  Epilepsy Attending: Lora Havens  Referring Physician/Provider: Donnetta Simpers, MD  Duration: 10/20/2022 0148 to 10/21/2022 0148  Patient history:  27 y.o. male with PMH significant for marijuana use who presents with 2 seizure like episodes and intubated. EEG to evaluate for seizure  Level of alertness:  comatose  AEDs during EEG study: LEV, propofol  Technical aspects: This EEG study was done with scalp electrodes positioned according to the 10-20 International system of electrode placement. Electrical activity was reviewed with band pass filter of 1-70Hz , sensitivity of 7 uV/mm, display speed of 58mm/sec with a 60Hz  notched filter applied as appropriate. EEG data were recorded continuously and digitally stored.  Video monitoring was available and reviewed as appropriate.  Description: EEG showed continuous generalized 3 to 6 Hz theta-delta slowing with overriding 15 to 18 Hz beta activity distributed symmetrically and diffusely. Hyperventilation and photic stimulation were not performed.     ABNORMALITY - Continuous slow, generalized  IMPRESSION: This study is suggestive of severe diffuse encephalopathy, nonspecific etiology but likely related to sedation. No seizures or epileptiform discharges were seen throughout the recording.  Timothy Lynch Barbra Sarks

## 2022-10-20 NOTE — Progress Notes (Addendum)
eLink Physician-Brief Progress Note Patient Name: Timothy Lynch DOB: [birth year] MRN: [id number]   Date of Service  10/20/2022  HPI/Events of Note  Brief new admit note:   27 y.o. male with PMH significant for marijuana use who presents with 2 seizure like episodes and intubated.  Girlfriend reports he had a single seizure back in 2014.   As per neurology notes from ED: "No obvious provoking factor based on history provided.  No recent head injuries, no recent infections.  Vitals with low-grade fever, labs with no significant electrolyte abnormality, no leukocytosis. CTH neg. MRI pending to do.   Camera: Discussed with RN. On lung protective ventilation, stable VS. On restraints. Sz precautions. S/p keppra.    Data: Reviewed 7.33/42/333/23 Lax 2 normal. Influenza B +, on fluvir. Tox benzos and THC +. UA neg Salicylate, tylenol, alcohol neg  CxR: ET in place. Left lowg volume. Rotated film. Left basal atelectasis vs and effusion . Aspiration ? Rare to aspirate to left, but possible.    eICU Interventions  - seizure precautions -on anti viral and Unasyn empirically for LLL vs atelectasis. Follow CxR in AM - VTE sq heparin - on VAP bundle. Wean VT, fio2 as tolerated - MRI/ECHO - SAT/SBT.  -EEG.  - CBG goals <180.   Discussed with RN.      Intervention Category Major Interventions: Seizures - evaluation and management Intermediate Interventions: Best-practice therapies (e.g. DVT, beta blocker, etc.) Evaluation Type: New Patient Evaluation  Elmer Sow 10/20/2022, 1:10 AM  01:38 Has a fever of 101.7. Had tylenol at 2251 in the ED. Has ice packs on.   Would you like to do any additional interventions?  - do not see any contraindication for NSAID, low dose motrin ordered  03:16 Temp cont to rise, now 104 er foley temp. Has received Motrin, Tylenol and packed in ice. Perhaps we can get an order for cooling blanket next. Just had blood cultures drawn in ED.  (+)  RVP for Influenza B (already on Tamiflu)  Cooling blanket ordered  4:09 AM BSRN attempting to get ordered MRI however Pt having increased secretion, difficult to maximize sedation to be able to get MRI, Prop 50 (max), Fent 100 (+) bolus'. BSRN asking if MRI can be delayed. Not in room, down in MRI. - yes, can be delayed to AM, since CT head was negative from 20 th.   6:24 Fever is getting better slowly at 103, rectally. IV tylenol 1 gm ordered, discussed with RN.

## 2022-10-21 ENCOUNTER — Inpatient Hospital Stay (HOSPITAL_COMMUNITY): Payer: Self-pay

## 2022-10-21 DIAGNOSIS — I517 Cardiomegaly: Secondary | ICD-10-CM

## 2022-10-21 LAB — MAGNESIUM
Magnesium: 2.1 mg/dL (ref 1.7–2.4)
Magnesium: 2.1 mg/dL (ref 1.7–2.4)
Magnesium: 2.1 mg/dL (ref 1.7–2.4)

## 2022-10-21 LAB — GLUCOSE, CAPILLARY
Glucose-Capillary: 78 mg/dL (ref 70–99)
Glucose-Capillary: 86 mg/dL (ref 70–99)
Glucose-Capillary: 81 mg/dL (ref 70–99)

## 2022-10-21 LAB — CBC
HCT: 38.3 % — ABNORMAL LOW (ref 39.0–52.0)
Hemoglobin: 12.7 g/dL — ABNORMAL LOW (ref 13.0–17.0)
MCH: 31.4 pg (ref 26.0–34.0)
MCHC: 33.2 g/dL (ref 30.0–36.0)
MCV: 94.6 fL (ref 80.0–100.0)
Platelets: 157 10*3/uL (ref 150–400)
RBC: 4.05 MIL/uL — ABNORMAL LOW (ref 4.22–5.81)
RDW: 12.3 % (ref 11.5–15.5)
WBC: 12.4 10*3/uL — ABNORMAL HIGH (ref 4.0–10.5)
nRBC: 0 % (ref 0.0–0.2)

## 2022-10-21 LAB — COMPREHENSIVE METABOLIC PANEL
ALT: 18 U/L (ref 0–44)
AST: 67 U/L — ABNORMAL HIGH (ref 15–41)
Albumin: 2.9 g/dL — ABNORMAL LOW (ref 3.5–5.0)
Alkaline Phosphatase: 60 U/L (ref 38–126)
Anion gap: 8 (ref 5–15)
BUN: 10 mg/dL (ref 6–20)
CO2: 24 mmol/L (ref 22–32)
Calcium: 7.7 mg/dL — ABNORMAL LOW (ref 8.9–10.3)
Chloride: 104 mmol/L (ref 98–111)
Creatinine, Ser: 0.95 mg/dL (ref 0.61–1.24)
GFR, Estimated: >60 mL/min (ref >60)
Glucose, Bld: 108 mg/dL — ABNORMAL HIGH (ref 70–99)
Potassium: 3.4 mmol/L — ABNORMAL LOW (ref 3.5–5.1)
Sodium: 136 mmol/L (ref 135–145)
Total Bilirubin: 4.6 mg/dL — ABNORMAL HIGH (ref 0.3–1.2)
Total Protein: 5.8 g/dL — ABNORMAL LOW (ref 6.5–8.1)

## 2022-10-21 LAB — ECHOCARDIOGRAM COMPLETE
AR max vel: 3.76 cm2
AV Area VTI: 3.81 cm2
AV Area mean vel: 3.78 cm2
AV Mean grad: 3 mmHg
AV Peak grad: 5.8 mmHg
Ao pk vel: 1.2 m/s
Height: 75 in
S' Lateral: 3.7 cm
Weight: 3234.59 oz

## 2022-10-21 LAB — PHOSPHORUS
Phosphorus: 1.6 mg/dL — ABNORMAL LOW (ref 2.5–4.6)
Phosphorus: 1.6 mg/dL — ABNORMAL LOW (ref 2.5–4.6)

## 2022-10-21 LAB — TRIGLYCERIDES: Triglycerides: 217 mg/dL — ABNORMAL HIGH (ref <150)

## 2022-10-21 MED ORDER — PROSOURCE TF20 ENFIT COMPATIBL EN LIQD
60.0000 mL | Freq: Every day | ENTERAL | Status: DC
  Administered 2022-10-21 – 2022-10-22 (×2): 60 mL
  Filled 2022-10-21 (×2): qty 60

## 2022-10-21 MED ORDER — VITAL HIGH PROTEIN PO LIQD
1000.0000 mL | ORAL | Status: DC
  Administered 2022-10-21: 1000 mL

## 2022-10-21 MED ORDER — FENTANYL BOLUS VIA INFUSION
200.0000 ug | INTRAVENOUS | Status: DC | PRN
  Administered 2022-10-21 – 2022-10-23 (×5): 200 ug via INTRAVENOUS

## 2022-10-21 MED ORDER — OXYCODONE HCL 5 MG PO TABS
10.0000 mg | ORAL_TABLET | Freq: Four times a day (QID) | ORAL | Status: DC
  Administered 2022-10-21 – 2022-10-22 (×4): 10 mg
  Filled 2022-10-21 (×4): qty 2

## 2022-10-21 MED ORDER — POTASSIUM CHLORIDE 20 MEQ PO PACK
40.0000 meq | PACK | Freq: Once | ORAL | Status: AC
  Administered 2022-10-21: 40 meq
  Filled 2022-10-21: qty 2

## 2022-10-21 MED ORDER — MIDAZOLAM HCL 2 MG/2ML IJ SOLN
4.0000 mg | INTRAMUSCULAR | Status: DC | PRN
  Administered 2022-10-22 – 2022-10-23 (×4): 4 mg via INTRAVENOUS
  Filled 2022-10-21 (×6): qty 4

## 2022-10-21 MED ORDER — CLONAZEPAM 0.25 MG PO TBDP
1.0000 mg | ORAL_TABLET | Freq: Two times a day (BID) | ORAL | Status: DC
  Administered 2022-10-21 (×2): 1 mg
  Filled 2022-10-21 (×2): qty 4

## 2022-10-21 MED ORDER — SODIUM CHLORIDE 0.9 % IV SOLN
2.0000 g | INTRAVENOUS | Status: DC
  Administered 2022-10-22 – 2022-10-24 (×3): 2 g via INTRAVENOUS
  Filled 2022-10-21 (×3): qty 20

## 2022-10-21 NOTE — Progress Notes (Signed)
Subjective: Sedated on fentanyl at a rate of 200 and propofol at a rate of 50. Cooling blanket in place. Was febrile  overnight. Influenza B came back positive overnight. LP not consistent with meningitis.    Objective: Current vital signs: BP (!) 108/59 (BP Location: Right Arm)   Pulse 80   Temp 97.9 F (36.6 C) (Bladder)   Resp 18   Ht 6\' 3"  (1.905 m)   Wt 91.7 kg   SpO2 100%   BMI 25.27 kg/m  Vital signs in last 24 hours: Temp:  [97.9 F (36.6 C)-103.7 F (39.8 C)] 97.9 F (36.6 C) (01/22 0800) Pulse Rate:  [71-118] 80 (01/22 0800) Resp:  [16-28] 18 (01/22 0800) BP: (89-131)/(56-84) 108/59 (01/22 0800) SpO2:  [91 %-100 %] 100 % (01/22 0800) FiO2 (%):  [40 %] 40 % (01/22 0745) Weight:  [91.7 kg] 91.7 kg (01/22 0415)  Intake/Output from previous day: 01/21 0701 - 01/22 0700 In: 4286.4 [I.V.:2952.5; IV Piggyback:1333.8] Out: 1500 [Urine:1500] Intake/Output this shift: Total I/O In: 305.8 [I.V.:205.8; IV Piggyback:100] Out: 450 [Urine:450] Nutritional status:  Diet Order             Diet NPO time specified  Diet effective now                  HEENT: [place]/AT. No nuchal rigidity Lungs: Intubated Ext: No edema  Neurologic Exam: Sedated on fentanyl at a rate of 200 and propofol at a rate of 50.  Ment: In the context of sedation, he does not respond to voice and does not exhibit purposeful movements. No spontaneous eye opening (sedated). There is semipurposeful withdrawal of BLE to stimulation.  CN: PERRL. No blink to threat. No doll's eye reflex in the context of sedation. Eyes midline with no gaze deviation. Face flaccidly symmetric.  Motor/Sensory: Decreased tone x 4 without asymmetry. No movement of upper extremities to pinch (sedated). Semipurposeful withdrawal movements of BLE to stimulation.  Cerebellar/Gait: Unable to assess.  Other: No jerking, twitching or posturing noted.    Lab Results: Results for orders placed or performed during the hospital  encounter of 10/19/22 (from the past 48 hour(s))  CBC with Differential     Status: Abnormal   Collection Time: 10/19/22  9:50 PM  Result Value Ref Range   WBC 7.8 4.0 - 10.5 K/uL   RBC 4.62 4.22 - 5.81 MIL/uL   Hemoglobin 14.5 13.0 - 17.0 g/dL   HCT 16.1 09.6 - 04.5 %   MCV 91.8 80.0 - 100.0 fL   MCH 31.4 26.0 - 34.0 pg   MCHC 34.2 30.0 - 36.0 g/dL   RDW 40.9 81.1 - 91.4 %   Platelets 204 150 - 400 K/uL   nRBC 0.0 0.0 - 0.2 %   Neutrophils Relative % 75 %   Neutro Abs 5.9 1.7 - 7.7 K/uL   Lymphocytes Relative 8 %   Lymphs Abs 0.6 (L) 0.7 - 4.0 K/uL   Monocytes Relative 16 %   Monocytes Absolute 1.3 (H) 0.1 - 1.0 K/uL   Eosinophils Relative 0 %   Eosinophils Absolute 0.0 0.0 - 0.5 K/uL   Basophils Relative 0 %   Basophils Absolute 0.0 0.0 - 0.1 K/uL   Immature Granulocytes 1 %   Abs Immature Granulocytes 0.04 0.00 - 0.07 K/uL    Comment: Performed at [facility], [address], [place], [place] [address]  Comprehensive metabolic panel     Status: Abnormal   Collection Time: 10/19/22  9:50  PM  Result Value Ref Range   Sodium 134 (L) 135 - 145 mmol/L   Potassium 3.2 (L) 3.5 - 5.1 mmol/L   Chloride 100 98 - 111 mmol/L   CO2 22 22 - 32 mmol/L   Glucose, Bld 93 70 - 99 mg/dL    Comment: Glucose reference range applies only to samples taken after fasting for at least 8 hours.   BUN 9 6 - 20 mg/dL   Creatinine, Ser 0.08 0.61 - 1.24 mg/dL   Calcium 8.7 (L) 8.9 - 10.3 mg/dL   Total Protein 7.5 6.5 - 8.1 g/dL   Albumin 4.1 3.5 - 5.0 g/dL   AST 42 (H) 15 - 41 U/L   ALT 18 0 - 44 U/L   Alkaline Phosphatase 65 38 - 126 U/L   Total Bilirubin 1.4 (H) 0.3 - 1.2 mg/dL   GFR, Estimated >67 >61 mL/min    Comment: (NOTE) Calculated using the CKD-EPI Creatinine Equation (2021)    Anion gap 12 5 - 15    Comment: Performed at [facility], [address], [place], [place] [address]  Ethanol     Status: None   Collection Time: 10/19/22  9:50 PM  Result Value Ref Range    Alcohol, Ethyl (B) <10 <10 mg/dL    Comment: (NOTE) Lowest detectable limit for serum alcohol is 10 mg/dL.  For medical purposes only. Performed at [facility], [address] [place], [place] [address]   Acetaminophen level     Status: Abnormal   Collection Time: 10/19/22  9:50 PM  Result Value Ref Range   Acetaminophen (Tylenol), Serum <10 (L) 10 - 30 ug/mL    Comment: (NOTE) Therapeutic concentrations vary significantly. A range of 10-30 ug/mL  may be an effective concentration for many patients. However, some  are best treated at concentrations outside of this range. Acetaminophen concentrations >150 ug/mL at 4 hours after ingestion  and >50 ug/mL at 12 hours after ingestion are often associated with  toxic reactions.  Performed at [facility], [address], [place], [place] [address]   Salicylate level     Status: Abnormal   Collection Time: 10/19/22  9:50 PM  Result Value Ref Range   Salicylate Lvl <7.0 (L) 7.0 - 30.0 mg/dL    Comment: Performed at [facility], [address], [place], [place] [address]  Lactic acid, plasma     Status: None   Collection Time: 10/19/22  9:50 PM  Result Value Ref Range   Lactic Acid, Venous 0.9 0.5 - 1.9 mmol/L    Comment: Performed at [facility], [address] [place], [place] [address]  Resp panel by RT-PCR (RSV, Flu A&B, Covid) Anterior Nasal Swab     Status: Abnormal   Collection Time: 10/19/22  9:54 PM   Specimen: Anterior Nasal Swab  Result Value Ref Range   SARS Coronavirus 2 by RT PCR NEGATIVE NEGATIVE    Comment: (NOTE) SARS-CoV-2 target nucleic acids are NOT DETECTED.  The SARS-CoV-2 RNA is generally detectable in upper respiratory specimens during the acute phase of infection. The lowest concentration of SARS-CoV-2 viral copies this assay can detect is 138 copies/mL. A negative result does not preclude SARS-Cov-2 infection and should not be used as the sole basis for treatment or other  patient management decisions. A negative result may occur with  improper specimen collection/handling, submission of specimen other than nasopharyngeal swab, presence of viral mutation(s) within the  areas targeted by this assay, and inadequate number of viral copies(<138 copies/mL). A negative result must be combined with clinical observations, patient history, and epidemiological information. The expected result is Negative.  Fact Sheet for Patients:  BloggerCourse.com  Fact Sheet for Healthcare Providers:  SeriousBroker.it  This test is no t yet approved or cleared by the [place] FDA and  has been authorized for detection and/or diagnosis of SARS-CoV-2 by FDA under an Emergency Use Authorization (EUA). This EUA will remain  in effect (meaning this test can be used) for the duration of the COVID-19 declaration under Section 564(b)(1) of the Act, 21 U.S.C.section 360bbb-3(b)(1), unless the authorization is terminated  or revoked sooner.       Influenza A by PCR NEGATIVE NEGATIVE   Influenza B by PCR POSITIVE (A) NEGATIVE    Comment: (NOTE) The Xpert Xpress SARS-CoV-2/FLU/RSV plus assay is intended as an aid in the diagnosis of influenza from Nasopharyngeal swab specimens and should not be used as a sole basis for treatment. Nasal washings and aspirates are unacceptable for Xpert Xpress SARS-CoV-2/FLU/RSV testing.  Fact Sheet for Patients: BloggerCourse.com  Fact Sheet for Healthcare Providers: SeriousBroker.it  This test is not yet approved or cleared by the [place] FDA and has been authorized for detection and/or diagnosis of SARS-CoV-2 by FDA under an Emergency Use Authorization (EUA). This EUA will remain in effect (meaning this test can be used) for the duration of the COVID-19 declaration under Section 564(b)(1) of the Act, 21 U.S.C. section 360bbb-3(b)(1),  unless the authorization is terminated or revoked.     Resp Syncytial Virus by PCR NEGATIVE NEGATIVE    Comment: (NOTE) Fact Sheet for Patients: BloggerCourse.com  Fact Sheet for Healthcare Providers: SeriousBroker.it  This test is not yet approved or cleared by the [place] FDA and has been authorized for detection and/or diagnosis of SARS-CoV-2 by FDA under an Emergency Use Authorization (EUA). This EUA will remain in effect (meaning this test can be used) for the duration of the COVID-19 declaration under Section 564(b)(1) of the Act, 21 U.S.C. section 360bbb-3(b)(1), unless the authorization is terminated or revoked.  Performed at [facility], [address], [place], [place] [address]   I-Stat arterial blood gas, ED     Status: Abnormal   Collection Time: 10/19/22 10:33 PM  Result Value Ref Range   pH, Arterial 7.336 (L) 7.35 - 7.45   pCO2 arterial 41.3 32 - 48 mmHg   pO2, Arterial 333 (H) 83 - 108 mmHg   Bicarbonate 21.8 20.0 - 28.0 mmol/L   TCO2 23 22 - 32 mmol/L   O2 Saturation 100 %   Acid-base deficit 3.0 (H) 0.0 - 2.0 mmol/L   Sodium 137 135 - 145 mmol/L   Potassium 3.3 (L) 3.5 - 5.1 mmol/L   Calcium, Ion 1.16 1.15 - 1.40 mmol/L   HCT 41.0 39.0 - 52.0 %   Hemoglobin 13.9 13.0 - 17.0 g/dL   Patient temperature 604.5 F    Collection site RADIAL, ALLEN'S TEST ACCEPTABLE    Drawn by RT    Sample type ARTERIAL   Urinalysis, Routine w reflex microscopic     Status: Abnormal   Collection Time: 10/19/22 10:57 PM  Result Value Ref Range   Color, Urine YELLOW YELLOW   APPearance CLOUDY (A) CLEAR   Specific Gravity, Urine 1.019 1.005 - 1.030   pH 5.0 5.0 - 8.0   Glucose, UA NEGATIVE NEGATIVE mg/dL   Hgb urine dipstick SMALL (A) NEGATIVE  Bilirubin Urine NEGATIVE NEGATIVE   Ketones, ur 20 (A) NEGATIVE mg/dL   Protein, ur 573100 (A) NEGATIVE mg/dL   Nitrite NEGATIVE NEGATIVE   Leukocytes,Ua NEGATIVE  NEGATIVE   RBC / HPF 0-5 0 - 5 RBC/hpf   WBC, UA 0-5 0 - 5 WBC/hpf   Bacteria, UA RARE (A) NONE SEEN   Squamous Epithelial / HPF 0-5 0 - 5 /HPF   Mucus PRESENT    Amorphous Crystal PRESENT     Comment: Performed at [facility], [address] [place], [place] [address]  Rapid urine drug screen (hospital performed)     Status: Abnormal   Collection Time: 10/19/22 10:57 PM  Result Value Ref Range   Opiates NONE DETECTED NONE DETECTED   Cocaine NONE DETECTED NONE DETECTED   Benzodiazepines POSITIVE (A) NONE DETECTED   Amphetamines NONE DETECTED NONE DETECTED   Tetrahydrocannabinol POSITIVE (A) NONE DETECTED   Barbiturates NONE DETECTED NONE DETECTED    Comment: (NOTE) DRUG SCREEN FOR MEDICAL PURPOSES ONLY.  IF CONFIRMATION IS NEEDED FOR ANY PURPOSE, NOTIFY LAB WITHIN 5 DAYS.  LOWEST DETECTABLE LIMITS FOR URINE DRUG SCREEN Drug Class                     Cutoff (ng/mL) Amphetamine and metabolites    1000 Barbiturate and metabolites    200 Benzodiazepine                 200 Opiates and metabolites        300 Cocaine and metabolites        300 THC                            50 Performed at [facility], [address] [place], [place] [address]   Blood culture (routine x 2)     Status: None (Preliminary result)   Collection Time: 10/19/22 11:00 PM   Specimen: BLOOD  Result Value Ref Range   Specimen Description BLOOD SITE NOT SPECIFIED    Special Requests      BOTTLES DRAWN AEROBIC AND ANAEROBIC Blood Culture adequate volume   Culture      NO GROWTH 2 DAYS Performed at [facility], [address] [place], [place] [address]    Report Status PENDING   Lactic acid, plasma     Status: None   Collection Time: 10/19/22 11:24 PM  Result Value Ref Range   Lactic Acid, Venous 0.9 0.5 - 1.9 mmol/L    Comment: Performed at [facility], [address] [place], [place] [address]  Blood culture (routine x 2)     Status: None (Preliminary result)    Collection Time: 10/19/22 11:24 PM   Specimen: BLOOD  Result Value Ref Range   Specimen Description BLOOD SITE NOT SPECIFIED    Special Requests      BOTTLES DRAWN AEROBIC AND ANAEROBIC Blood Culture adequate volume   Culture      NO GROWTH 2 DAYS Performed at [facility], [address] [place], [place] [address]    Report Status PENDING   HIV Antibody (routine testing w rflx)     Status: None   Collection Time: 10/20/22  1:20 AM  Result Value Ref Range   HIV Screen 4th Generation wRfx Non Reactive Non Reactive    Comment: Performed at [facility], [address] [place], [place] [address]  Procalcitonin     Status: None   Collection Time: 10/20/22  1:20 AM  Result Value Ref Range   Procalcitonin 0.16 ng/mL    Comment:        Interpretation: PCT (Procalcitonin) <= 0.5 ng/mL: Systemic infection (sepsis) is not likely. Local bacterial infection is possible. (NOTE)       Sepsis PCT Algorithm           Lower Respiratory Tract                                      Infection PCT Algorithm    ----------------------------     ----------------------------         PCT < 0.25 ng/mL                PCT < 0.10 ng/mL          Strongly encourage             Strongly discourage   discontinuation of antibiotics    initiation of antibiotics    ----------------------------     -----------------------------       PCT 0.25 - 0.50 ng/mL            PCT 0.10 - 0.25 ng/mL               OR       >80% decrease in PCT            Discourage initiation of                                            antibiotics      Encourage discontinuation           of antibiotics    ----------------------------     -----------------------------         PCT >= 0.50 ng/mL              PCT 0.26 - 0.50 ng/mL               AND        <80% decrease in PCT             Encourage initiation of                                             antibiotics       Encourage continuation           of antibiotics     ----------------------------     -----------------------------        PCT >= 0.50 ng/mL                  PCT > 0.50 ng/mL               AND         increase in PCT                  Strongly encourage                                      initiation  of antibiotics    Strongly encourage escalation           of antibiotics                                     -----------------------------                                           PCT <= 0.25 ng/mL                                                 OR                                        > 80% decrease in PCT                                      Discontinue / Do not initiate                                             antibiotics  Performed at [facility], [address], [place] [address]   CBC     Status: None   Collection Time: 10/20/22  1:20 AM  Result Value Ref Range   WBC 6.1 4.0 - 10.5 K/uL   RBC 4.58 4.22 - 5.81 MIL/uL   Hemoglobin 14.2 13.0 - 17.0 g/dL   HCT 42.8 39.0 - 52.0 %   MCV 93.4 80.0 - 100.0 fL   MCH 31.0 26.0 - 34.0 pg   MCHC 33.2 30.0 - 36.0 g/dL   RDW 12.3 11.5 - 15.5 %   Platelets 197 150 - 400 K/uL   nRBC 0.0 0.0 - 0.2 %    Comment: Performed at [facility], [place] [address] [place], [place] [address]  Basic metabolic panel     Status: Abnormal   Collection Time: 10/20/22  1:20 AM  Result Value Ref Range   Sodium 135 135 - 145 mmol/L   Potassium 3.5 3.5 - 5.1 mmol/L   Chloride 102 98 - 111 mmol/L   CO2 21 (L) 22 - 32 mmol/L   Glucose, Bld 91 70 - 99 mg/dL    Comment: Glucose reference range applies only to samples taken after fasting for at least 8 hours.   BUN 10 6 - 20 mg/dL   Creatinine, Ser 1.02 0.61 - 1.24 mg/dL   Calcium 8.2 (L) 8.9 - 10.3 mg/dL   GFR, Estimated >60 >60 mL/min    Comment: (NOTE) Calculated using the CKD-EPI Creatinine Equation (2021)    Anion gap 12 5 - 15    Comment: Performed at [place] [address], [place], [place] [address]  Magnesium      Status: None   Collection Time: 10/20/22  1:20 AM  Result Value Ref Range   Magnesium 2.0 1.7 - 2.4  mg/dL    Comment: Performed at [facility], [address], [place], [place] [address]  Phosphorus     Status: None   Collection Time: 10/20/22  1:20 AM  Result Value Ref Range   Phosphorus 4.4 2.5 - 4.6 mg/dL    Comment: Performed at [facility], [address] [place], [place] [address]  MRSA Next Gen by PCR, Nasal     Status: None   Collection Time: 10/20/22  1:37 AM   Specimen: Nasal Mucosa; Nasal Swab  Result Value Ref Range   MRSA by PCR Next Gen NOT DETECTED NOT DETECTED    Comment: (NOTE) The GeneXpert MRSA Assay (FDA approved for NASAL specimens only), is one component of a comprehensive MRSA colonization surveillance program. It is not intended to diagnose MRSA infection nor to guide or monitor treatment for MRSA infections. Test performance is not FDA approved in patients less than 34 years old. Performed at [facility], [address] [place], [place] [address]   CSF culture w Gram Stain     Status: None (Preliminary result)   Collection Time: 10/20/22  6:29 PM   Specimen: CSF; Cerebrospinal Fluid  Result Value Ref Range   Specimen Description CSF    Special Requests NONE    Gram Stain      CYTOSPIN SMEAR WBC PRESENT, PREDOMINANTLY MONONUCLEAR NO ORGANISMS SEEN    Culture      NO GROWTH < 24 HOURS Performed at [facility], [address] [place], [place] [address]    Report Status PENDING   Meningitis/Encephalitis Panel (CSF)     Status: None   Collection Time: 10/20/22  6:29 PM  Result Value Ref Range   Cryptococcus neoformans/gattii (CSF) NOT DETECTED NOT DETECTED    Comment: (NOTE) Patients with a suspicion of cryptococcal meningitis should be tested  for cryptococcal antigen (CrAg).      Cytomegalovirus (CSF) NOT DETECTED NOT DETECTED   Enterovirus (CSF) NOT DETECTED NOT DETECTED   Escherichia coli K1 (CSF) NOT DETECTED  NOT DETECTED    Comment: (NOTE) Only E. coli strains possessing the K1 capsular antigen will be detected.      Haemophilus influenzae (CSF) NOT DETECTED NOT DETECTED   Herpes simplex virus 1 (CSF) NOT DETECTED NOT DETECTED   Herpes simplex virus 2 (CSF) NOT DETECTED NOT DETECTED   Human herpesvirus 6 (CSF) NOT DETECTED NOT DETECTED   Human parechovirus (CSF) NOT DETECTED NOT DETECTED   Listeria monocytogenes (CSF) NOT DETECTED NOT DETECTED   Neisseria meningitis (CSF) NOT DETECTED NOT DETECTED    Comment: (NOTE) Only encapsulated strains of N. meningitidis will be detected.     Streptococcus agalactiae (CSF) NOT DETECTED NOT DETECTED   Streptococcus pneumoniae (CSF) NOT DETECTED NOT DETECTED   Varicella zoster virus (CSF) NOT DETECTED NOT DETECTED    Comment: Performed at [facility], [address], [place], [place] [address]  CSF cell count with differential collection tube #: 1     Status: Abnormal   Collection Time: 10/20/22  6:46 PM  Result Value Ref Range   Tube # 1    Color, CSF COLORLESS COLORLESS   Appearance, CSF CLEAR (A) CLEAR   Supernatant NOT INDICATED    RBC Count, CSF 350 (H) 0 /cu mm   WBC, CSF 2 0 - 5 /cu mm   Other Cells, CSF TOO FEW TO COUNT, SMEAR AVAILABLE FOR REVIEW     Comment: Rare segmented neutrophil,  rare lymphocyte noted on smear. Performed at [facility], [address] [place], [place] [address]   CSF cell count with differential collection tube #: 4     Status: Abnormal   Collection Time: 10/20/22  6:46 PM  Result Value Ref Range   Tube # 4    Color, CSF COLORLESS COLORLESS   Appearance, CSF CLEAR (A) CLEAR   Supernatant NOT INDICATED    RBC Count, CSF 2 (H) 0 /cu mm   WBC, CSF 3 0 - 5 /cu mm   Other Cells, CSF TOO FEW TO COUNT, SMEAR AVAILABLE FOR REVIEW     Comment: Rare lymphocyte noted on smear. Performed at [facility], [address] [place], [place] [address]   Protein and glucose, CSF     Status: None    Collection Time: 10/20/22  6:46 PM  Result Value Ref Range   Glucose, CSF 55 40 - 70 mg/dL   Total  Protein, CSF 34 15 - 45 mg/dL    Comment: Performed at [facility], [address] [place], [place] [address]  CBC with Differential/Platelet     Status: Abnormal   Collection Time: 10/20/22  8:49 PM  Result Value Ref Range   WBC 22.9 (H) 4.0 - 10.5 K/uL   RBC 4.35 4.22 - 5.81 MIL/uL   Hemoglobin 14.0 13.0 - 17.0 g/dL   HCT 84.641.1 96.239.0 - 95.252.0 %   MCV 94.5 80.0 - 100.0 fL   MCH 32.2 26.0 - 34.0 pg   MCHC 34.1 30.0 - 36.0 g/dL   RDW 84.112.2 32.411.5 - 40.115.5 %   Platelets 169 150 - 400 K/uL    Comment: REPEATED TO VERIFY   nRBC 0.0 0.0 - 0.2 %   Neutrophils Relative % 87 %   Neutro Abs 19.9 (H) 1.7 - 7.7 K/uL   Lymphocytes Relative 5 %   Lymphs Abs 1.1 0.7 - 4.0 K/uL   Monocytes Relative 7 %   Monocytes Absolute 1.6 (H) 0.1 - 1.0 K/uL   Eosinophils Relative 0 %   Eosinophils Absolute 0.0 0.0 - 0.5 K/uL   Basophils Relative 0 %   Basophils Absolute 0.0 0.0 - 0.1 K/uL   nRBC 0 0 /100 WBC   Promyelocytes Relative 1 %   Abs Immature Granulocytes 0.20 (H) 0.00 - 0.07 K/uL    Comment: Performed at [facility], [address] [place], [place] [address]  HIV Antibody (routine testing w rflx)     Status: None   Collection Time: 10/20/22  8:49 PM  Result Value Ref Range   HIV Screen 4th Generation wRfx Non Reactive Non Reactive    Comment: Performed at [facility], [address] [place], [place] [address]  Culture, blood (Routine X 2)     Status: None (Preliminary result)   Collection Time: 10/20/22  8:49 PM   Specimen: BLOOD  Result Value Ref Range   Specimen Description BLOOD SITE NOT SPECIFIED    Special Requests      BOTTLES DRAWN AEROBIC AND ANAEROBIC Blood Culture adequate volume   Culture      NO GROWTH < 12 HOURS Performed at [facility], [address] [place], [place] [address]    Report Status PENDING   Culture, blood (Routine X 2)     Status: None  (Preliminary result)   Collection Time: 10/20/22  8:49 PM   Specimen: BLOOD  Result Value Ref Range  Specimen Description BLOOD SITE NOT SPECIFIED    Special Requests IN PEDIATRIC BOTTLE Blood Culture adequate volume    Culture      NO GROWTH < 12 HOURS Performed at [facility], [place] [address], [place], [place] [address]    Report Status PENDING   Triglycerides     Status: Abnormal   Collection Time: 10/21/22  4:54 AM  Result Value Ref Range   Triglycerides 217 (H) <150 mg/dL    Comment: Performed at [place] [address], [place], [place] [address]  Comprehensive metabolic panel     Status: Abnormal   Collection Time: 10/21/22  4:54 AM  Result Value Ref Range   Sodium 136 135 - 145 mmol/L   Potassium 3.4 (L) 3.5 - 5.1 mmol/L   Chloride 104 98 - 111 mmol/L   CO2 24 22 - 32 mmol/L   Glucose, Bld 108 (H) 70 - 99 mg/dL    Comment: Glucose reference range applies only to samples taken after fasting for at least 8 hours.   BUN 10 6 - 20 mg/dL   Creatinine, Ser 0.95 0.61 - 1.24 mg/dL   Calcium 7.7 (L) 8.9 - 10.3 mg/dL   Total Protein 5.8 (L) 6.5 - 8.1 g/dL   Albumin 2.9 (L) 3.5 - 5.0 g/dL   AST 67 (H) 15 - 41 U/L   ALT 18 0 - 44 U/L   Alkaline Phosphatase 60 38 - 126 U/L   Total Bilirubin 4.6 (H) 0.3 - 1.2 mg/dL   GFR, Estimated >60 >60 mL/min    Comment: (NOTE) Calculated using the CKD-EPI Creatinine Equation (2021)    Anion gap 8 5 - 15    Comment: Performed at [facility], [place] [address], [place], [place] [address]  Magnesium     Status: None   Collection Time: 10/21/22  4:54 AM  Result Value Ref Range   Magnesium 2.1 1.7 - 2.4 mg/dL    Comment: Performed at [place] [address], [place], [place] [address]  CBC     Status: Abnormal   Collection Time: 10/21/22  6:25 AM  Result Value Ref Range   WBC 12.4 (H) 4.0 - 10.5 K/uL   RBC 4.05 (L) 4.22 - 5.81 MIL/uL   Hemoglobin 12.7 (L) 13.0 - 17.0 g/dL   HCT 38.3 (L) 39.0 - 52.0 %    MCV 94.6 80.0 - 100.0 fL   MCH 31.4 26.0 - 34.0 pg   MCHC 33.2 30.0 - 36.0 g/dL   RDW 12.3 11.5 - 15.5 %   Platelets 157 150 - 400 K/uL   nRBC 0.0 0.0 - 0.2 %    Comment: Performed at [facility], [place] [address] [place],  [address]    Recent Results (from the past 240 hour(s))  Resp panel by RT-PCR (RSV, Flu A&B, Covid) Anterior Nasal Swab     Status: Abnormal   Collection Time: 10/19/22  9:54 PM   Specimen: Anterior Nasal Swab  Result Value Ref Range Status   SARS Coronavirus 2 by RT PCR NEGATIVE NEGATIVE Final    Comment: (NOTE) SARS-CoV-2 target nucleic acids are NOT DETECTED.  The SARS-CoV-2 RNA is generally detectable in upper respiratory specimens during the acute phase of infection. The lowest concentration of SARS-CoV-2 viral copies this assay can detect is 138 copies/mL. A negative result does not preclude SARS-Cov-2 infection and should not be used as the sole basis for treatment or other  patient management decisions. A negative result may occur with  improper specimen collection/handling, submission of specimen other than nasopharyngeal swab, presence of viral mutation(s) within the areas targeted by this assay, and inadequate number of viral copies(<138 copies/mL). A negative result must be combined with clinical observations, patient history, and epidemiological information. The expected result is Negative.  Fact Sheet for Patients:  BloggerCourse.com  Fact Sheet for Healthcare Providers:  SeriousBroker.it  This test is no t yet approved or cleared by the [place] FDA and  has been authorized for detection and/or diagnosis of SARS-CoV-2 by FDA under an Emergency Use Authorization (EUA). This EUA will remain  in effect (meaning this test can be used) for the duration of the COVID-19 declaration under Section 564(b)(1) of the Act, 21 U.S.C.section 360bbb-3(b)(1), unless the authorization is  terminated  or revoked sooner.       Influenza A by PCR NEGATIVE NEGATIVE Final   Influenza B by PCR POSITIVE (A) NEGATIVE Final    Comment: (NOTE) The Xpert Xpress SARS-CoV-2/FLU/RSV plus assay is intended as an aid in the diagnosis of influenza from Nasopharyngeal swab specimens and should not be used as a sole basis for treatment. Nasal washings and aspirates are unacceptable for Xpert Xpress SARS-CoV-2/FLU/RSV testing.  Fact Sheet for Patients: BloggerCourse.com  Fact Sheet for Healthcare Providers: SeriousBroker.it  This test is not yet approved or cleared by the [place] FDA and has been authorized for detection and/or diagnosis of SARS-CoV-2 by FDA under an Emergency Use Authorization (EUA). This EUA will remain in effect (meaning this test can be used) for the duration of the COVID-19 declaration under Section 564(b)(1) of the Act, 21 U.S.C. section 360bbb-3(b)(1), unless the authorization is terminated or revoked.     Resp Syncytial Virus by PCR NEGATIVE NEGATIVE Final    Comment: (NOTE) Fact Sheet for Patients: BloggerCourse.com  Fact Sheet for Healthcare Providers: SeriousBroker.it  This test is not yet approved or cleared by the [place] FDA and has been authorized for detection and/or diagnosis of SARS-CoV-2 by FDA under an Emergency Use Authorization (EUA). This EUA will remain in effect (meaning this test can be used) for the duration of the COVID-19 declaration under Section 564(b)(1) of the Act, 21 U.S.C. section 360bbb-3(b)(1), unless the authorization is terminated or revoked.  Performed at [facility], [address], [place], [place] [address]   Blood culture (routine x 2)     Status: None (Preliminary result)   Collection Time: 10/19/22 11:00 PM   Specimen: BLOOD  Result Value Ref Range Status   Specimen Description BLOOD SITE NOT  SPECIFIED  Final   Special Requests   Final    BOTTLES DRAWN AEROBIC AND ANAEROBIC Blood Culture adequate volume   Culture   Final    NO GROWTH 2 DAYS Performed at [facility], [address] [place], [place] [address]    Report Status PENDING  Incomplete  Blood culture (routine x 2)     Status: None (Preliminary result)   Collection Time: 10/19/22 11:24 PM   Specimen: BLOOD  Result Value Ref Range Status   Specimen Description BLOOD SITE NOT SPECIFIED  Final   Special Requests   Final    BOTTLES DRAWN AEROBIC AND ANAEROBIC Blood Culture adequate volume   Culture   Final    NO GROWTH 2 DAYS Performed at [facility], [address] [place], [place] [address]    Report Status PENDING  Incomplete  MRSA Next  Gen by PCR, Nasal     Status: None   Collection Time: 10/20/22  1:37 AM   Specimen: Nasal Mucosa; Nasal Swab  Result Value Ref Range Status   MRSA by PCR Next Gen NOT DETECTED NOT DETECTED Final    Comment: (NOTE) The GeneXpert MRSA Assay (FDA approved for NASAL specimens only), is one component of a comprehensive MRSA colonization surveillance program. It is not intended to diagnose MRSA infection nor to guide or monitor treatment for MRSA infections. Test performance is not FDA approved in patients less than 39 years old. Performed at [facility], [address], [place], [place] [address]   CSF culture w Gram Stain     Status: None (Preliminary result)   Collection Time: 10/20/22  6:29 PM   Specimen: CSF; Cerebrospinal Fluid  Result Value Ref Range Status   Specimen Description CSF  Final   Special Requests NONE  Final   Gram Stain   Final    CYTOSPIN SMEAR WBC PRESENT, PREDOMINANTLY MONONUCLEAR NO ORGANISMS SEEN    Culture   Final    NO GROWTH < 24 HOURS Performed at [facility], [address], [place], [place] [address]    Report Status PENDING  Incomplete  Culture, blood (Routine X 2)     Status: None (Preliminary result)    Collection Time: 10/20/22  8:49 PM   Specimen: BLOOD  Result Value Ref Range Status   Specimen Description BLOOD SITE NOT SPECIFIED  Final   Special Requests   Final    BOTTLES DRAWN AEROBIC AND ANAEROBIC Blood Culture adequate volume   Culture   Final    NO GROWTH < 12 HOURS Performed at [facility], [address], [place], [place] [address]    Report Status PENDING  Incomplete  Culture, blood (Routine X 2)     Status: None (Preliminary result)   Collection Time: 10/20/22  8:49 PM   Specimen: BLOOD  Result Value Ref Range Status   Specimen Description BLOOD SITE NOT SPECIFIED  Final   Special Requests IN PEDIATRIC BOTTLE Blood Culture adequate volume  Final   Culture   Final    NO GROWTH < 12 HOURS Performed at [facility], [address], [place], [place] [address]    Report Status PENDING  Incomplete    Lipid Panel Recent Labs    10/21/22 0454  TRIG 217*    Studies/Results: DG Chest Port 1 View  Result Date: 10/21/2022 CLINICAL DATA:  Influenza with endotracheal tube. EXAM: PORTABLE CHEST 1 VIEW COMPARISON:  10/19/2022 FINDINGS: 0517 hours. Unchanged endotracheal tube with tip projecting 3.0 cm above the carina. Enteric tube tip and side port project over the stomach. Improved aeration of the left upper lobe with decreased leftward mediastinal shift. Persistent retrocardiac opacity and volume loss. Small left pleural effusion. No pneumothorax. Right lung is well aerated. IMPRESSION: 1. Stable support tubes. 2. Improved aeration of the left upper lobe with persistent retrocardiac opacity and small left pleural effusion. Electronically Signed   By: Orvan Falconer M.D.   On: 10/21/2022 07:53   MR BRAIN WO CONTRAST  Result Date: 10/20/2022 CLINICAL DATA:  Seizure, new-onset, no history of trauma EXAM: MRI HEAD WITHOUT CONTRAST TECHNIQUE: Multiplanar, multiecho pulse sequences of the brain and surrounding structures were obtained without intravenous contrast.  COMPARISON:  CT head 10/19/2022. FINDINGS: Brain: No acute infarction, hemorrhage, hydrocephalus, extra-axial collection or mass lesion. Unremarkable hippocampi. Vascular: Major arterial flow voids are  maintained at the skull base. Skull and upper cervical spine: Normal marrow signal. Sinuses/Orbits: Mild paranasal sinus mucosal thickening. No acute orbital findings. Other: No mastoid effusions. IMPRESSION: Normal brain MRI.  No evidence of acute intracranial abnormality. Electronically Signed   By: Feliberto Harts M.D.   On: 10/20/2022 15:36   Overnight EEG with video  Result Date: 10/20/2022 Charlsie Quest, MD     10/20/2022  8:28 AM Patient Name: SANUEL LADNIER MRN: [id number] Epilepsy Attending: Charlsie Quest Referring Physician/Provider: Erick Blinks, MD Duration: 10/20/2022 0148 to 10/20/2022 9735 Patient history:  27 y.o. male with PMH significant for marijuana use who presents with 2 seizure like episodes and intubated. EEG to evaluate for seizure Level of alertness:  comatose AEDs during EEG study: LEV, propofol Technical aspects: This EEG study was done with scalp electrodes positioned according to the 10-20 International system of electrode placement. Electrical activity was reviewed with band pass filter of 1-70Hz , sensitivity of 7 uV/mm, display speed of 67mm/sec with a 60Hz  notched filter applied as appropriate. EEG data were recorded continuously and digitally stored.  Video monitoring was available and reviewed as appropriate. Description: EEG showed continuous generalized 3 to 6 Hz theta-delta slowing with overriding 15 to 18 Hz beta activity distributed symmetrically and diffusely. Hyperventilation and photic stimulation were not performed.   ABNORMALITY - Continuous slow, generalized IMPRESSION: This study is suggestive of severe diffuse encephalopathy, nonspecific etiology but likely related to sedation. No seizures or epileptiform discharges were seen throughout the recording.  Priyanka   CT HEAD WO CONTRAST (Annabelle Harman)  Result Date: 10/19/2022 CLINICAL DATA:  Seizure, new onset, no history of trauma. EXAM: CT HEAD WITHOUT CONTRAST TECHNIQUE: Contiguous axial images were obtained from the base of the skull through the vertex without intravenous contrast. RADIATION DOSE REDUCTION: This exam was performed according to the departmental dose-optimization program which includes automated exposure control, adjustment of the mA and/or kV according to patient size and/or use of iterative reconstruction technique. COMPARISON:  None Available. FINDINGS: Brain: No acute intracranial hemorrhage, midline shift or mass effect. No extra-axial fluid collection. Gray-white matter differentiation is within normal limits. No hydrocephalus. Vascular: No hyperdense vessel or unexpected calcification. Skull: Normal. Negative for fracture or focal lesion. Sinuses/Orbits: Mild mucosal thickening in the ethmoid air cells bilaterally and maxillary sinuses. Other: Soft tissue swelling is present at the nose. There is deformity of the nasal bones bilaterally. IMPRESSION: 1. No acute intracranial process. 2. Bilateral nasal bone deformity. Correlate clinically for acute or chronic fractures. Electronically Signed   By: 10/21/2022 M.D.   On: 10/19/2022 22:31   DG Abd Portable 1 View  Result Date: 10/19/2022 CLINICAL DATA:  Orogastric tube placement. EXAM: PORTABLE ABDOMEN - 1 VIEW COMPARISON:  None Available. FINDINGS: An orogastric tube is seen with its distal end looped within the region of the gastric fundus. A dilated small bowel loop is seen within the mid left abdomen. No radio-opaque calculi or other significant radiographic abnormality are seen. IMPRESSION: 1. Orogastric tube positioning, as described above. 2. Dilated small bowel loops within the mid left abdomen. While this may be transient in nature, further evaluation with abdomen pelvis CT is recommended if a partial small bowel obstruction is  of clinical concern. Electronically Signed   By: 10/21/2022 M.D.   On: 10/19/2022 22:20   DG Chest Port 1 View  Result Date: 10/19/2022 CLINICAL DATA:  Endotracheal tube and orogastric tube placement. EXAM: PORTABLE CHEST 1 VIEW COMPARISON:  January 04, 2013 FINDINGS: An endotracheal tube is seen with its distal tip approximately 5.3 cm from the carina. An enteric tube is noted with its distal end looped within the expected region of the gastric antrum. The cardiac silhouette is within limits. Low lung volumes are seen on the left with mild to moderate severity left basilar atelectasis and/or infiltrate. Mild right to left shift of the mediastinal structures is noted. There is no evidence of a pleural effusion or pneumothorax. The visualized skeletal structures are unremarkable. IMPRESSION: 1. Endotracheal tube and enteric tube positioning, as described above. 2. Mild to moderate severity left basilar atelectasis and/or infiltrate with left-sided volume loss. Electronically Signed   By: Aram Candela M.D.   On: 10/19/2022 22:19    Medications: Prior to Admission:  No medications prior to admission.   Scheduled:  Chlorhexidine Gluconate Cloth  6 each Topical Q0600   docusate  100 mg Per Tube BID   famotidine  20 mg Per Tube BID   heparin  5,000 Units Subcutaneous Q8H   levETIRAcetam  500 mg Oral BID   mouth rinse  15 mL Mouth Rinse Q2H   oseltamivir  75 mg Per Tube BID   pantoprazole (PROTONIX) IV  40 mg Intravenous QHS   polyethylene glycol  17 g Per Tube Daily   Continuous:  sodium chloride 20 mL/hr at 10/21/22 0800   sodium chloride 125 mL/hr at 10/21/22 0800   acyclovir Stopped (10/21/22 0514)   cefTRIAXone (ROCEPHIN)  IV Stopped (10/21/22 0758)   fentaNYL infusion INTRAVENOUS 200 mcg/hr (10/21/22 0800)   levETIRAcetam Stopped (10/20/22 2224)   norepinephrine (LEVOPHED) Adult infusion 4 mcg/min (10/21/22 0800)   propofol (DIPRIVAN) infusion 50 mcg/kg/min (10/21/22 0800)    vancomycin Stopped (10/21/22 1610)      Assessment: 27 y.o. male with a PMH significant for marijuana use and one GTC seizure lasting approximately 4-5 minutes that occurred approximately 8 years ago for which he never sought medical attention, who presents with 2 seizure like episodes.  and intubated.  Girlfriend reported he had a single seizure back in 2014 which was confirmed by his sister today. MRI brain without contrast was normal and continuous EEG on Sunday only showed continuous generalized slowing with no epileptiform abnormalities. Over the course of Sunday he had become increasingly febrile up to 103.7 raising concern for meningitis.  He was started on empiric coverage with Vanco/ceftriaxone/acyclovir.  - LP was performed yesterday night, with results showing normal WBC, normal protein, clear and colorless. CSF viral PCR panel negative.  - MRI brain without contrast: Normal brain MRI. No evidence of acute intracranial abnormality.  - LTM EEG report for today AM is pending. On bedside review, there is diffuse slowing without definite electrographic seizures.  - UDS was positive for THC - Influenza B came back positive. CBC reveals leukocytosis.  - Overall impression: Breakthrough seizure secondary to febrile illness in a patient with one prior seizure in his lifetime. Semiology described by family is most consistent with generalized onset. Long term anticonvulsant therapy is indicated.   Recommendations: - Consider discontinuation of vanc/ceftriaxone/acyclovir given negative LP.  - Continue Keppra 500 mg bid during this admission and at discharge - Outpatient Neurology follow up - Inpatient seizure precautions - Outpatient seizure precautions: Per [facility] statutes, patients with seizures are not allowed to drive until  they have been seizure-free for six months. Use caution when using heavy equipment or power tools. Avoid working on ladders or at heights. Take showers instead  of baths. Ensure the water temperature is not too high on the home water heater. Do not go swimming alone. When caring for infants or small children, sit down when holding, feeding, or changing them to minimize risk of injury to the child in the event you have a seizure. Also, Maintain good sleep hygiene. Avoid alcohol. - Continue LTM EEG until off all sedation.   40 minutes spent in the neurological evaluation and management of this critically ill patient.    LOS: 1 day   @Electronically  signed: Dr. Caryl Pina 10/21/2022  8:54 AM

## 2022-10-21 NOTE — Procedures (Addendum)
Patient Name: Timothy Lynch  MRN: [id number]  Epilepsy Attending: Lora Havens  Referring Physician/Provider: Donnetta Simpers, MD  Duration: 10/21/2022 0148 to 10/22/2022 0148   Patient history:  27 y.o. male with PMH significant for marijuana use who presents with 2 seizure like episodes and intubated. EEG to evaluate for seizure   Level of alertness:  comatose   AEDs during EEG study: LEV, propofol   Technical aspects: This EEG study was done with scalp electrodes positioned according to the 10-20 International system of electrode placement. Electrical activity was reviewed with band pass filter of 1-70Hz , sensitivity of 7 uV/mm, display speed of 42mm/sec with a 60Hz  notched filter applied as appropriate. EEG data were recorded continuously and digitally stored.  Video monitoring was available and reviewed as appropriate.   Description: EEG showed continuous generalized 3 to 6 Hz theta-delta slowing with overriding 15 to 18 Hz beta activity distributed symmetrically and diffusely. Hyperventilation and photic stimulation were not performed.     Event button was pressed on 10/21/2022 at 0954 for right arm jerking. Concomitant eeg before, during and after the event didn't show any eeg change to suggest seizure.    ABNORMALITY - Continuous slow, generalized   IMPRESSION: This study is suggestive of severe diffuse encephalopathy, nonspecific etiology but likely related to sedation. No seizures or epileptiform discharges were seen throughout the recording.  Event button was pressed on 10/21/2022 at 0954 for right arm jerking without concomitant eeg change. This was most likely NOT an epileptic event.    Arionna Hoggard Barbra Sarks

## 2022-10-21 NOTE — Progress Notes (Signed)
Attempted Echocardiogram at 11.00 AM, patient is agitated and uncooperative. Spoke with the nurse and she suggested to try back later this afternoon.

## 2022-10-21 NOTE — H&P (Signed)
NAME:  Timothy Lynch, MRN:  [id number], DOB:  1995-10-15, LOS: 1 ADMISSION DATE:  10/19/2022 CONSULTATION DATE:  10/20/2022 REFERRING MD:  Darl Householder - EDP CHIEF COMPLAINT:  Seizure   History of Present Illness:  27 year old man who presented to [facility] ED via EMS after seizure x 2. No significant PMHx; remote history of seizure x 1 in 2014 (per girlfriend at bedside), remote history of asthma, marijuana use.  History is obtained from chart and patient's girlfriend (at bedside). States patient first had a seizure morning of admission (1/20) around 0730 without any prodromal symptoms. No recent c/o fever/chills/CP/SOB, no dizziness or HA. Patient drinks socially but no heavy drinking or daily alcohol use. Patient does use marijuana. Patient subsequently had 2 seizures throughout the course of the day, each lasting several minutes. EMS was called after patient appeared postictal and confused. On ED arrival, agitated and combative and not redirectable. Versed 10mg  IM administered by EMS and restraints utilized. Attempted CT Head with Ativan and Haldol but patient did not tolerate this due to ongoing agitation and aggression toward staff. Geodon, Ativan and Benadryl administered by EDP. Ketamine initiated. Due to need for increased sedation and airway protection, patient was intubated in ED.   In ED, mildly febrile to 100.59F, tachycardic to 100s, normotensive, SpO2 100%. Labs demonstrated CBC WNL, Na 134, K 3.2, CO2 22, Cr 0.99, Ca 8.7. AST/ALT/AP WNL, Tbili mildly elevated to 1.4. LA 0.9. APAP/salicylates negative and UDS positive for benzos/THC. Ethanol negative. UA with small Hgb, ketones, protein. Flu B positive, COVID/RSV negative. BCx pending. CXR with mild to moderate L basilar atelectasis vs infiltrate; CT Head NAICA, chronic nasal bone fx. Neuro consulted for seizures.  PCCM consulted for ICU admission.  Pertinent Medical History:  Remote history of seizure x 1 (2014), remote history of asthma, marijuana  use  Significant Hospital Events: Including procedures, antibiotic start and stop dates in addition to other pertinent events   1/20 - BIB EMS for seizures x 2 at home. LG fever to 100.59F, no other symptoms. Flu B+. Agitated/aggressive/not redirectable in ED requiring increased sedation; intubated for airway protection.  CT Head NAICA. Neuro consulted. PCCM consulted for ICU admission.  Interim History / Subjective:  Remains very agitated.  On quite a bit of sedation.  Minimal stimulation and he becomes difficult to manage, swinging limbs, hard to redirect.  Responds well to midazolam per RN report.  LP results come back reassuring, glucose, protein okay, WBCs 2-3 but with a number of reds reassuring.  Objective:  Blood pressure (!) 96/53, pulse 83, temperature 99.8 F (37.7 C), temperature source Bladder, resp. rate 18, height 6\' 3"  (1.905 m), weight 91.7 kg, SpO2 100 %.    Vent Mode: PRVC FiO2 (%):  [40 %] 40 % Set Rate:  [18 bmp] 18 bmp Vt Set:  [470 mL-670 mL] 670 mL PEEP:  [5 cmH20] 5 cmH20 Plateau Pressure:  [16 cmH20-20 cmH20] 19 cmH20   Intake/Output Summary (Last 24 hours) at 10/21/2022 1227 Last data filed at 10/21/2022 1200 Gross per 24 hour  Intake 5447.95 ml  Output 2575 ml  Net 2872.95 ml    Filed Weights   10/19/22 2348 10/20/22 0500 10/21/22 0415  Weight: 91 kg 90.3 kg 91.7 kg   Physical Examination: General: Acutely ill-appearing young man in NAD.  HEENT: [place]/AT, anicteric sclera, pupils equal round  Neuro: Sedated, intermittently agitated and reaching for ETT with RUE. Withdraws to pain in all 4 extremities. Not following commands. Moves all 4 extremities  spontaneously.  +Cough and +Gag  CV: RRR, no m/g/r. PULM: Breathing even and unlabored on vent (PEEP 5, FiO2 40%). Lung fields with coarse sounds GI: Soft, nontender, nondistended. Normoactive bowel sounds. Extremities: No LE edema noted. Skin: Warm/dry, no rashes.  Resolved Hospital Problem List:     Assessment & Plan:  Seizure of unknown etiology Presented to [facility] ED via EMS for seizure x 2 at home. Per girlfriend at bedside, remote history of 1 seizure ~2014. Not on any AEDs at home, does not follow with Neurology. No symptoms other than fevers at home. - Neuro consulted, appreciate recommendations - AEDs per Neuro (Keppra, prop, Versed PRN) - LTM EEG, no further seizures -MRI brain okay - Seizure precautions - Neuroprotective measures: HOB > 30 degrees, normoglycemia, normothermia, electrolytes WNL -LP 1/21, results reassuring with normal glucose, protein, low WBC  Acute hypoxemic respiratory failure in the setting of influenza B and sedation for seizure activity Remote history of asthma - Continue full vent support (4-8cc/kg IBW) - Wean FiO2 for O2 sat > 90% - Daily WUA/SBT as sedation weaning/mental status tolerates - VAP bundle - Bronchodilators PRN - Pulmonary hygiene - PAD protocol for sedation: Propofol and Fentanyl for goal RASS -1 to -2  Influenza B infection Possible aspiration PNA Fever Tested positive for Flu B on RVP 1/20. - Tamiflu x 5 days - CTX  for CAP/aspiration coverage (?aspiration during seizure) - F/u  Resp Cx - LP reassuring, stop acyclovir, vancomycin  Toxic/metabolic encephalopathy: Ingestion related psychosis, other underlying issue, really unclear.  High sedation needs with a lot of agitation. -Oral Klonopin 1 mg twice daily, oxycodone 10 mg every 6 hours start 1/21 -Continue propofol, fentanyl gtt. -LP reassuring   Best Practice: (right click and "Reselect all SmartList Selections" daily)   Diet/type: NPO w/ meds via tube DVT prophylaxis: SCDs, SQH GI prophylaxis: PPI Lines: N/A Foley:  N/A Code Status:  full code Last date of multidisciplinary goals of care discussion [Pending]  Labs:  CBC: Recent Labs  Lab 10/19/22 2150 10/19/22 2233 10/20/22 0120 10/20/22 2049 10/21/22 0625  WBC 7.8  --  6.1 22.9* 12.4*  NEUTROABS 5.9   --   --  19.9*  --   HGB 14.5 13.9 14.2 14.0 12.7*  HCT 42.4 41.0 42.8 41.1 38.3*  MCV 91.8  --  93.4 94.5 94.6  PLT 204  --  197 169 157    Basic Metabolic Panel: Recent Labs  Lab 10/19/22 2150 10/19/22 2233 10/20/22 0120 10/21/22 0454  NA 134* 137 135 136  K 3.2* 3.3* 3.5 3.4*  CL 100  --  102 104  CO2 22  --  21* 24  GLUCOSE 93  --  91 108*  BUN 9  --  10 10  CREATININE 0.99  --  1.02 0.95  CALCIUM 8.7*  --  8.2* 7.7*  MG  --   --  2.0 2.1  PHOS  --   --  4.4  --     GFR: Estimated Creatinine Clearance: 138.4 mL/min (by C-G formula based on SCr of 0.95 mg/dL). Recent Labs  Lab 10/19/22 2150 10/19/22 2324 10/20/22 0120 10/20/22 2049 10/21/22 0625  PROCALCITON  --   --  0.16  --   --   WBC 7.8  --  6.1 22.9* 12.4*  LATICACIDVEN 0.9 0.9  --   --   --     Liver Function Tests: Recent Labs  Lab 10/19/22 2150 10/21/22 0454  AST 42* 67*  ALT  18 18  ALKPHOS 65 60  BILITOT 1.4* 4.6*  PROT 7.5 5.8*  ALBUMIN 4.1 2.9*    No results for input(s): "LIPASE", "AMYLASE" in the last 168 hours. No results for input(s): "AMMONIA" in the last 168 hours.  ABG:    Component Value Date/Time   PHART 7.336 (L) 10/19/2022 2233   PCO2ART 41.3 10/19/2022 2233   PO2ART 333 (H) 10/19/2022 2233   HCO3 21.8 10/19/2022 2233   TCO2 23 10/19/2022 2233   ACIDBASEDEF 3.0 (H) 10/19/2022 2233   O2SAT 100 10/19/2022 2233    Coagulation Profile: No results for input(s): "INR", "PROTIME" in the last 168 hours.  Cardiac Enzymes: No results for input(s): "CKTOTAL", "CKMB", "CKMBINDEX", "TROPONINI" in the last 168 hours.  HbA1C: No results found for: "HGBA1C"  CBG: No results for input(s): "GLUCAP" in the last 168 hours.  Review of Systems:   Patient is encephalopathic and/or intubated. Therefore history has been obtained from chart review.   Past Medical History:  He,  has no past medical history on file.   Surgical History:  History reviewed. No pertinent surgical  history.   Social History:   reports that he has been smoking cigarettes. He has been smoking an average of .5 packs per day. He has never used smokeless tobacco. He reports current alcohol use. He reports current drug use. Frequency: 7.00 times per week. Drug: Marijuana.   Family History:  His family history is not on file.   Allergies: No Known Allergies   Home Medications: Prior to Admission medications   Not on File    Critical care time:    CRITICAL CARE Performed by: Lanier Clam   Total critical care time: 40 minutes  Critical care time was exclusive of separately billable procedures and treating other patients.  Critical care was necessary to treat or prevent imminent or life-threatening deterioration.  Critical care was time spent personally by me on the following activities: development of treatment plan with patient and/or surrogate as well as nursing, discussions with consultants, evaluation of patient's response to treatment, examination of patient, obtaining history from patient or surrogate, ordering and performing treatments and interventions, ordering and review of laboratory studies, ordering and review of radiographic studies, pulse oximetry and re-evaluation of patient's condition.   Lanier Clam, MD [facility] 10/21/22 12:27 PM  Please see [email or web address] for contact info.  From 7A-7P if no response, please call [phone] After hours, please call ELink [phone]

## 2022-10-21 NOTE — Progress Notes (Signed)
Pt's mother called at this time w/o password. Therefore I could not provide information to her. She seemed a little upset. I asked her if she could call other family members to try to obtain the password, after she asked me to provided her w/ the password. I spoke w/ Rosalin Hawking (sister) and updated her and request that she talk w/ her mother and provide password or the information she choose.

## 2022-10-21 NOTE — Progress Notes (Signed)
  Echocardiogram 2D Echocardiogram attempted at 0800. Pt is too agitated and uncooperative to proceed with exam. Will attempt again once pt is stable.  Eartha Inch 10/21/2022, 8:07 AM

## 2022-10-21 NOTE — Progress Notes (Signed)
Echocardiogram 2D Echocardiogram has been performed.  Timothy Lynch 10/21/2022, 3:31 PM

## 2022-10-21 NOTE — Progress Notes (Signed)
Brief Nutrition Note  Consult received for enteral/tube feeding initiation and management.  Adult Enteral Nutrition Protocol initiated. Full assessment to follow tomorrow, 10/22/22.  OG tube in place with tip located in stomach per chest x-ray from this AM ("enteric tube tip and sie port project over the stomach").  Admitting Dx: Seizure ([place]) [R56.9] Post-ictal confusion [F05]  Body mass index is 25.27 kg/m. Pt meets criteria for normal weight based on current BMI.  Labs:  Recent Labs  Lab 10/19/22 2150 10/19/22 2233 10/20/22 0120 10/21/22 0454  NA 134* 137 135 136  K 3.2* 3.3* 3.5 3.4*  CL 100  --  102 104  CO2 22  --  21* 24  BUN 9  --  10 10  CREATININE 0.99  --  1.02 0.95  CALCIUM 8.7*  --  8.2* 7.7*  MG  --   --  2.0 2.1  PHOS  --   --  4.4  --   GLUCOSE 93  --  91 108*    Gustavus Bryant, MS, RD, LDN Inpatient Clinical Dietitian Please see [email or web address] for contact information.

## 2022-10-21 NOTE — Progress Notes (Signed)
[place] Progress Note Patient Name: Timothy Lynch DOB: [birth year] MRN: [id number]   Date of Service  10/21/2022  HPI/Events of Note  Restraints expiring for vented patient  eICU Interventions  Renewed for risk of pulling lines and ETT      Intervention Category Minor Interventions: Agitation / anxiety - evaluation and management  [name] 10/21/2022, 8:59 PM

## 2022-10-22 LAB — GLUCOSE, CAPILLARY
Glucose-Capillary: 79 mg/dL (ref 70–99)
Glucose-Capillary: 95 mg/dL (ref 70–99)
Glucose-Capillary: 97 mg/dL (ref 70–99)
Glucose-Capillary: 91 mg/dL (ref 70–99)
Glucose-Capillary: 105 mg/dL — ABNORMAL HIGH (ref 70–99)
Glucose-Capillary: 107 mg/dL — ABNORMAL HIGH (ref 70–99)

## 2022-10-22 LAB — BASIC METABOLIC PANEL
Anion gap: 10 (ref 5–15)
BUN: 9 mg/dL (ref 6–20)
CO2: 22 mmol/L (ref 22–32)
Calcium: 7.9 mg/dL — ABNORMAL LOW (ref 8.9–10.3)
Chloride: 107 mmol/L (ref 98–111)
Creatinine, Ser: 0.71 mg/dL (ref 0.61–1.24)
GFR, Estimated: >60 mL/min (ref >60)
Glucose, Bld: 88 mg/dL (ref 70–99)
Potassium: 3.2 mmol/L — ABNORMAL LOW (ref 3.5–5.1)
Sodium: 139 mmol/L (ref 135–145)

## 2022-10-22 LAB — PHOSPHORUS
Phosphorus: 1.7 mg/dL — ABNORMAL LOW (ref 2.5–4.6)
Phosphorus: 1.7 mg/dL — ABNORMAL LOW (ref 2.5–4.6)

## 2022-10-22 LAB — HSV 1/2 PCR, CSF
HSV-1 DNA: NEGATIVE
HSV-2 DNA: NEGATIVE

## 2022-10-22 LAB — MAGNESIUM
Magnesium: 1.9 mg/dL (ref 1.7–2.4)
Magnesium: 1.8 mg/dL (ref 1.7–2.4)

## 2022-10-22 MED ORDER — CLONAZEPAM 0.25 MG PO TBDP
1.0000 mg | ORAL_TABLET | Freq: Three times a day (TID) | ORAL | Status: DC
  Administered 2022-10-22 (×3): 1 mg
  Filled 2022-10-22 (×3): qty 4

## 2022-10-22 MED ORDER — ONDANSETRON HCL 4 MG/2ML IJ SOLN
INTRAMUSCULAR | Status: AC
  Administered 2022-10-22: 4 mg
  Filled 2022-10-22: qty 2

## 2022-10-22 MED ORDER — OXYCODONE HCL 5 MG PO TABS
15.0000 mg | ORAL_TABLET | ORAL | Status: DC
  Administered 2022-10-22 – 2022-10-23 (×6): 15 mg
  Filled 2022-10-22 (×6): qty 3

## 2022-10-22 MED ORDER — POTASSIUM CHLORIDE 20 MEQ PO PACK
40.0000 meq | PACK | Freq: Once | ORAL | Status: AC
  Administered 2022-10-22: 40 meq
  Filled 2022-10-22: qty 2

## 2022-10-22 MED ORDER — POTASSIUM PHOSPHATES 15 MMOLE/5ML IV SOLN
30.0000 mmol | Freq: Once | INTRAVENOUS | Status: AC
  Administered 2022-10-22: 30 mmol via INTRAVENOUS
  Filled 2022-10-22: qty 10

## 2022-10-22 MED ORDER — LEVETIRACETAM 500 MG PO TABS
500.0000 mg | ORAL_TABLET | Freq: Two times a day (BID) | ORAL | Status: DC
  Administered 2022-10-22: 500 mg

## 2022-10-22 MED ORDER — VITAL HIGH PROTEIN PO LIQD
1000.0000 mL | ORAL | Status: AC
  Administered 2022-10-22: 1000 mL

## 2022-10-22 MED ORDER — LEVETIRACETAM IN NACL 500 MG/100ML IV SOLN
500.0000 mg | Freq: Two times a day (BID) | INTRAVENOUS | Status: DC
  Administered 2022-10-23 (×2): 500 mg via INTRAVENOUS
  Filled 2022-10-22 (×3): qty 100

## 2022-10-22 MED ORDER — OSMOLITE 1.5 CAL PO LIQD
1000.0000 mL | ORAL | Status: DC
  Administered 2022-10-22 – 2022-10-23 (×2): 1000 mL

## 2022-10-22 NOTE — Progress Notes (Signed)
  Transition of Care [facility]) Screening Note   Patient Details  Name: Timothy Lynch Date of Birth: 29-Jun-1996   Transition of Care [facility]) CM/SW Contact:    Benard Halsted, LCSW Phone Number: 10/22/2022, 11:08 AM    Transition of Care Department [facility]) has reviewed patient. We will continue to monitor patient advancement through interdisciplinary progression rounds. If new patient transition needs arise, please place a TOC consult.

## 2022-10-22 NOTE — Progress Notes (Addendum)
Subjective: Continues on sedation, which was increased overnight (fentanyl gtt at a rate of 300, propofol gtt at 60)  Objective: Current vital signs: BP 102/62   Pulse 74   Temp 99.4 F (37.4 C) (Bladder)   Resp 18   Ht 6\' 3"  (1.905 m)   Wt 89.9 kg   SpO2 100%   BMI 24.77 kg/m  Vital signs in last 24 hours: Temp:  [95.7 F (35.4 C)-99.8 F (37.7 C)] 99.4 F (37.4 C) (01/23 0400) Pulse Rate:  [54-83] 74 (01/23 0731) Resp:  [15-22] 18 (01/23 0731) BP: (92-115)/(47-76) 102/62 (01/23 0731) SpO2:  [97 %-100 %] 100 % (01/23 0731) FiO2 (%):  [40 %] 40 % (01/23 0731) Weight:  [89.9 kg] 89.9 kg (01/23 0400)  Intake/Output from previous day: 01/22 0701 - 01/23 0700 In: 3844 [I.V.:2669.7; NG/GT:621.3; IV Piggyback:553] Out: 2975 [Urine:2975] Intake/Output this shift: No intake/output data recorded. Nutritional status:  Diet Order             Diet NPO time specified  Diet effective now                   HEENT: [place]/AT. No nuchal rigidity Lungs: Intubated Ext: No edema   Neurologic Exam: Sedated on fentanyl at a rate of 300 and propofol at a rate of 60.  Ment: In the context of sedation, he does not respond to voice and does not exhibit purposeful movements. No spontaneous eye opening (sedated). With noxious stimuli, will open eyes, flail upper extremities weakly and flex lower extremities at the hips and knees semipurposefully  CN: PERRL. No blink to threat. No doll's eye reflex in the context of sedation. Eyes midline with no gaze deviation. Face flaccidly symmetric. Corneal reflexes intact.  Motor/Sensory: Decreased tone x 4 without asymmetry. With noxious stimuli, will flail upper extremities weakly and flex lower extremities at the hips and knees semipurposefully   Reflexes: 2+ bilateral patellae. Toes equivocal.   Cerebellar/Gait: Unable to assess.  Other: No jerking, twitching or posturing noted.     Lab Results: Results for orders placed or performed during the  hospital encounter of 10/19/22 (from the past 48 hour(s))  CSF culture w Gram Stain     Status: None (Preliminary result)   Collection Time: 10/20/22  6:29 PM   Specimen: CSF; Cerebrospinal Fluid  Result Value Ref Range   Specimen Description CSF    Special Requests NONE    Gram Stain      CYTOSPIN SMEAR WBC PRESENT, PREDOMINANTLY MONONUCLEAR NO ORGANISMS SEEN    Culture      NO GROWTH < 24 HOURS Performed at [facility], [address] [place], [place] [address]    Report Status PENDING   Meningitis/Encephalitis Panel (CSF)     Status: None   Collection Time: 10/20/22  6:29 PM  Result Value Ref Range   Cryptococcus neoformans/gattii (CSF) NOT DETECTED NOT DETECTED    Comment: (NOTE) Patients with a suspicion of cryptococcal meningitis should be tested  for cryptococcal antigen (CrAg).      Cytomegalovirus (CSF) NOT DETECTED NOT DETECTED   Enterovirus (CSF) NOT DETECTED NOT DETECTED   Escherichia coli K1 (CSF) NOT DETECTED NOT DETECTED    Comment: (NOTE) Only E. coli strains possessing the K1 capsular antigen will be detected.      Haemophilus influenzae (CSF) NOT DETECTED NOT DETECTED   Herpes simplex virus 1 (CSF) NOT DETECTED NOT DETECTED   Herpes simplex virus 2 (CSF) NOT DETECTED NOT DETECTED  Human herpesvirus 6 (CSF) NOT DETECTED NOT DETECTED   Human parechovirus (CSF) NOT DETECTED NOT DETECTED   Listeria monocytogenes (CSF) NOT DETECTED NOT DETECTED   Neisseria meningitis (CSF) NOT DETECTED NOT DETECTED    Comment: (NOTE) Only encapsulated strains of N. meningitidis will be detected.     Streptococcus agalactiae (CSF) NOT DETECTED NOT DETECTED   Streptococcus pneumoniae (CSF) NOT DETECTED NOT DETECTED   Varicella zoster virus (CSF) NOT DETECTED NOT DETECTED    Comment: Performed at [facility] [address], [place], [place] [address]  CSF cell count with differential collection tube #: 1     Status: Abnormal   Collection Time: 10/20/22   6:46 PM  Result Value Ref Range   Tube # 1    Color, CSF COLORLESS COLORLESS   Appearance, CSF CLEAR (A) CLEAR   Supernatant NOT INDICATED    RBC Count, CSF 350 (H) 0 /cu mm   WBC, CSF 2 0 - 5 /cu mm   Other Cells, CSF TOO FEW TO COUNT, SMEAR AVAILABLE FOR REVIEW     Comment: Rare segmented neutrophil, rare lymphocyte noted on smear. Performed at [facility], [place] [address] [place], [place] [address]   CSF cell count with differential collection tube #: 4     Status: Abnormal   Collection Time: 10/20/22  6:46 PM  Result Value Ref Range   Tube # 4    Color, CSF COLORLESS COLORLESS   Appearance, CSF CLEAR (A) CLEAR   Supernatant NOT INDICATED    RBC Count, CSF 2 (H) 0 /cu mm   WBC, CSF 3 0 - 5 /cu mm   Other Cells, CSF TOO FEW TO COUNT, SMEAR AVAILABLE FOR REVIEW     Comment: Rare lymphocyte noted on smear. Performed at [facility], [place] [address], [place], [place] [address]   Protein and glucose, CSF     Status: None   Collection Time: 10/20/22  6:46 PM  Result Value Ref Range   Glucose, CSF 55 40 - 70 mg/dL   Total  Protein, CSF 34 15 - 45 mg/dL    Comment: Performed at [facility] [address], [place], [place] [address]  CBC with Differential/Platelet     Status: Abnormal   Collection Time: 10/20/22  8:49 PM  Result Value Ref Range   WBC 22.9 (H) 4.0 - 10.5 K/uL   RBC 4.35 4.22 - 5.81 MIL/uL   Hemoglobin 14.0 13.0 - 17.0 g/dL   HCT 41.1 39.0 - 52.0 %   MCV 94.5 80.0 - 100.0 fL   MCH 32.2 26.0 - 34.0 pg   MCHC 34.1 30.0 - 36.0 g/dL   RDW 12.2 11.5 - 15.5 %   Platelets 169 150 - 400 K/uL    Comment: REPEATED TO VERIFY   nRBC 0.0 0.0 - 0.2 %   Neutrophils Relative % 87 %   Neutro Abs 19.9 (H) 1.7 - 7.7 K/uL   Lymphocytes Relative 5 %   Lymphs Abs 1.1 0.7 - 4.0 K/uL   Monocytes Relative 7 %   Monocytes Absolute 1.6 (H) 0.1 - 1.0 K/uL   Eosinophils Relative 0 %   Eosinophils Absolute 0.0 0.0 - 0.5 K/uL   Basophils Relative 0 %   Basophils  Absolute 0.0 0.0 - 0.1 K/uL   nRBC 0 0 /100 WBC   Promyelocytes Relative 1 %   Abs Immature Granulocytes 0.20 (H) 0.00 - 0.07 K/uL  Comment: Performed at [facility], [address] [place], [place] [address]  HIV Antibody (routine testing w rflx)     Status: None   Collection Time: 10/20/22  8:49 PM  Result Value Ref Range   HIV Screen 4th Generation wRfx Non Reactive Non Reactive    Comment: Performed at [facility], [address] [place], [place] [address]  Culture, blood (Routine X 2)     Status: None (Preliminary result)   Collection Time: 10/20/22  8:49 PM   Specimen: BLOOD  Result Value Ref Range   Specimen Description BLOOD SITE NOT SPECIFIED    Special Requests      BOTTLES DRAWN AEROBIC AND ANAEROBIC Blood Culture adequate volume   Culture      NO GROWTH 2 DAYS Performed at [facility], [address] [place], [place] [address]    Report Status PENDING   Culture, blood (Routine X 2)     Status: None (Preliminary result)   Collection Time: 10/20/22  8:49 PM   Specimen: BLOOD  Result Value Ref Range   Specimen Description BLOOD SITE NOT SPECIFIED    Special Requests IN PEDIATRIC BOTTLE Blood Culture adequate volume    Culture      NO GROWTH 2 DAYS Performed at [facility], [address] [place], [place] [address]    Report Status PENDING   Triglycerides     Status: Abnormal   Collection Time: 10/21/22  4:54 AM  Result Value Ref Range   Triglycerides 217 (H) <150 mg/dL    Comment: Performed at [facility], [address] [place], [place] [address]  Comprehensive metabolic panel     Status: Abnormal   Collection Time: 10/21/22  4:54 AM  Result Value Ref Range   Sodium 136 135 - 145 mmol/L   Potassium 3.4 (L) 3.5 - 5.1 mmol/L   Chloride 104 98 - 111 mmol/L   CO2 24 22 - 32 mmol/L   Glucose, Bld 108 (H) 70 - 99 mg/dL    Comment: Glucose reference range applies only to samples taken after fasting for at least 8 hours.   BUN  10 6 - 20 mg/dL   Creatinine, Ser 4.010.95 0.61 - 1.24 mg/dL   Calcium 7.7 (L) 8.9 - 10.3 mg/dL   Total Protein 5.8 (L) 6.5 - 8.1 g/dL   Albumin 2.9 (L) 3.5 - 5.0 g/dL   AST 67 (H) 15 - 41 U/L   ALT 18 0 - 44 U/L   Alkaline Phosphatase 60 38 - 126 U/L   Total Bilirubin 4.6 (H) 0.3 - 1.2 mg/dL   GFR, Estimated >02>60 >72>60 mL/min    Comment: (NOTE) Calculated using the CKD-EPI Creatinine Equation (2021)    Anion gap 8 5 - 15    Comment: Performed at [facility], [address] [place], [place] [address]  Magnesium     Status: None   Collection Time: 10/21/22  4:54 AM  Result Value Ref Range   Magnesium 2.1 1.7 - 2.4 mg/dL    Comment: Performed at [facility], [address] [place], [place] [address]  CBC     Status: Abnormal   Collection Time: 10/21/22  6:25 AM  Result Value Ref Range   WBC 12.4 (H) 4.0 - 10.5 K/uL   RBC 4.05 (L) 4.22 - 5.81 MIL/uL   Hemoglobin 12.7 (L) 13.0 - 17.0 g/dL   HCT 42.538.3 (L) 95.639.0 -  52.0 %   MCV 94.6 80.0 - 100.0 fL   MCH 31.4 26.0 - 34.0 pg   MCHC 33.2 30.0 - 36.0 g/dL   RDW 15.7 26.2 - 03.5 %   Platelets 157 150 - 400 K/uL   nRBC 0.0 0.0 - 0.2 %    Comment: Performed at [facility], [address], [place], [place] [address]  Magnesium     Status: None   Collection Time: 10/21/22  2:12 PM  Result Value Ref Range   Magnesium 2.1 1.7 - 2.4 mg/dL    Comment: Performed at [facility], [address] [place], [place] [address]  Phosphorus     Status: Abnormal   Collection Time: 10/21/22  2:12 PM  Result Value Ref Range   Phosphorus 1.6 (L) 2.5 - 4.6 mg/dL    Comment: Performed at [facility], [address] [place], [place] [address]  Glucose, capillary     Status: None   Collection Time: 10/21/22  3:29 PM  Result Value Ref Range   Glucose-Capillary 78 70 - 99 mg/dL    Comment: Glucose reference range applies only to samples taken after fasting for at least 8 hours.  Magnesium     Status: None   Collection Time:  10/21/22  4:50 PM  Result Value Ref Range   Magnesium 2.1 1.7 - 2.4 mg/dL    Comment: Performed at [facility], [address], [place], [place] [address]  Phosphorus     Status: Abnormal   Collection Time: 10/21/22  4:50 PM  Result Value Ref Range   Phosphorus 1.6 (L) 2.5 - 4.6 mg/dL    Comment: Performed at [facility], [address], [place], [place] [address]  Glucose, capillary     Status: None   Collection Time: 10/21/22  7:27 PM  Result Value Ref Range   Glucose-Capillary 86 70 - 99 mg/dL    Comment: Glucose reference range applies only to samples taken after fasting for at least 8 hours.  Glucose, capillary     Status: None   Collection Time: 10/21/22 11:19 PM  Result Value Ref Range   Glucose-Capillary 81 70 - 99 mg/dL    Comment: Glucose reference range applies only to samples taken after fasting for at least 8 hours.  Glucose, capillary     Status: None   Collection Time: 10/22/22  3:12 AM  Result Value Ref Range   Glucose-Capillary 79 70 - 99 mg/dL    Comment: Glucose reference range applies only to samples taken after fasting for at least 8 hours.  Magnesium     Status: None   Collection Time: 10/22/22  5:11 AM  Result Value Ref Range   Magnesium 1.9 1.7 - 2.4 mg/dL    Comment: Performed at [facility], [address] [place], [place] [address]  Phosphorus     Status: Abnormal   Collection Time: 10/22/22  5:11 AM  Result Value Ref Range   Phosphorus 1.7 (L) 2.5 - 4.6 mg/dL    Comment: Performed at [facility], [address] [place], [place] [address]    Recent Results (from the past 240 hour(s))  Resp panel by RT-PCR (RSV, Flu A&B, Covid) Anterior Nasal Swab     Status: Abnormal   Collection Time: 10/19/22  9:54 PM   Specimen: Anterior Nasal Swab  Result Value Ref Range Status   SARS Coronavirus 2 by RT PCR NEGATIVE NEGATIVE  Final    Comment: (NOTE) SARS-CoV-2 target nucleic acids are NOT DETECTED.  The SARS-CoV-2 RNA is  generally detectable in upper respiratory specimens during the acute phase of infection. The lowest concentration of SARS-CoV-2 viral copies this assay can detect is 138 copies/mL. A negative result does not preclude SARS-Cov-2 infection and should not be used as the sole basis for treatment or other patient management decisions. A negative result may occur with  improper specimen collection/handling, submission of specimen other than nasopharyngeal swab, presence of viral mutation(s) within the areas targeted by this assay, and inadequate number of viral copies(<138 copies/mL). A negative result must be combined with clinical observations, patient history, and epidemiological information. The expected result is Negative.  Fact Sheet for Patients:  BloggerCourse.com  Fact Sheet for Healthcare Providers:  SeriousBroker.it  This test is no t yet approved or cleared by the [place] FDA and  has been authorized for detection and/or diagnosis of SARS-CoV-2 by FDA under an Emergency Use Authorization (EUA). This EUA will remain  in effect (meaning this test can be used) for the duration of the COVID-19 declaration under Section 564(b)(1) of the Act, 21 U.S.C.section 360bbb-3(b)(1), unless the authorization is terminated  or revoked sooner.       Influenza A by PCR NEGATIVE NEGATIVE Final   Influenza B by PCR POSITIVE (A) NEGATIVE Final    Comment: (NOTE) The Xpert Xpress SARS-CoV-2/FLU/RSV plus assay is intended as an aid in the diagnosis of influenza from Nasopharyngeal swab specimens and should not be used as a sole basis for treatment. Nasal washings and aspirates are unacceptable for Xpert Xpress SARS-CoV-2/FLU/RSV testing.  Fact Sheet for Patients: BloggerCourse.com  Fact Sheet for Healthcare Providers: SeriousBroker.it  This test is not yet approved or cleared by the  [place] FDA and has been authorized for detection and/or diagnosis of SARS-CoV-2 by FDA under an Emergency Use Authorization (EUA). This EUA will remain in effect (meaning this test can be used) for the duration of the COVID-19 declaration under Section 564(b)(1) of the Act, 21 U.S.C. section 360bbb-3(b)(1), unless the authorization is terminated or revoked.     Resp Syncytial Virus by PCR NEGATIVE NEGATIVE Final    Comment: (NOTE) Fact Sheet for Patients: BloggerCourse.com  Fact Sheet for Healthcare Providers: SeriousBroker.it  This test is not yet approved or cleared by the [place] FDA and has been authorized for detection and/or diagnosis of SARS-CoV-2 by FDA under an Emergency Use Authorization (EUA). This EUA will remain in effect (meaning this test can be used) for the duration of the COVID-19 declaration under Section 564(b)(1) of the Act, 21 U.S.C. section 360bbb-3(b)(1), unless the authorization is terminated or revoked.  Performed at [facility], [address] [place], [place] [address]   Blood culture (routine x 2)     Status: None (Preliminary result)   Collection Time: 10/19/22 11:00 PM   Specimen: BLOOD  Result Value Ref Range Status   Specimen Description BLOOD SITE NOT SPECIFIED  Final   Special Requests   Final    BOTTLES DRAWN AEROBIC AND ANAEROBIC Blood Culture adequate volume   Culture   Final    NO GROWTH 3 DAYS Performed at [facility], [address], [place], [place] [address]    Report Status PENDING  Incomplete  Blood culture (routine x 2)     Status: None (Preliminary result)   Collection Time: 10/19/22 11:24 PM   Specimen: BLOOD  Result Value Ref Range Status  Specimen Description BLOOD SITE NOT SPECIFIED  Final   Special Requests   Final    BOTTLES DRAWN AEROBIC AND ANAEROBIC Blood Culture adequate volume   Culture   Final    NO GROWTH 3 DAYS Performed at [facility], [address] [place], [place] [address]    Report Status PENDING  Incomplete  MRSA Next Gen by PCR, Nasal     Status: None   Collection Time: 10/20/22  1:37 AM   Specimen: Nasal Mucosa; Nasal Swab  Result Value Ref Range Status   MRSA by PCR Next Gen NOT DETECTED NOT DETECTED Final    Comment: (NOTE) The GeneXpert MRSA Assay (FDA approved for NASAL specimens only), is one component of a comprehensive MRSA colonization surveillance program. It is not intended to diagnose MRSA infection nor to guide or monitor treatment for MRSA infections. Test performance is not FDA approved in patients less than 29 years old. Performed at [facility], [address] [place], [place] [address]   CSF culture w Gram Stain     Status: None (Preliminary result)   Collection Time: 10/20/22  6:29 PM   Specimen: CSF; Cerebrospinal Fluid  Result Value Ref Range Status   Specimen Description CSF  Final   Special Requests NONE  Final   Gram Stain   Final    CYTOSPIN SMEAR WBC PRESENT, PREDOMINANTLY MONONUCLEAR NO ORGANISMS SEEN    Culture   Final    NO GROWTH < 24 HOURS Performed at [facility], [address] [place], [place] [address]    Report Status PENDING  Incomplete  Culture, blood (Routine X 2)     Status: None (Preliminary result)   Collection Time: 10/20/22  8:49 PM   Specimen: BLOOD  Result Value Ref Range Status   Specimen Description BLOOD SITE NOT SPECIFIED  Final   Special Requests   Final    BOTTLES DRAWN AEROBIC AND ANAEROBIC Blood Culture adequate volume   Culture   Final    NO GROWTH 2 DAYS Performed at [facility], [address] [place], [place] [address]    Report Status PENDING  Incomplete  Culture, blood (Routine X 2)     Status: None (Preliminary result)   Collection Time: 10/20/22  8:49 PM   Specimen: BLOOD  Result Value Ref Range Status   Specimen Description BLOOD SITE NOT SPECIFIED  Final   Special Requests IN PEDIATRIC  BOTTLE Blood Culture adequate volume  Final   Culture   Final    NO GROWTH 2 DAYS Performed at [facility], [address] [place], [place] [address]    Report Status PENDING  Incomplete    Lipid Panel Recent Labs    10/21/22 0454  TRIG 217*    Studies/Results: ECHOCARDIOGRAM COMPLETE  Result Date: 10/21/2022    ECHOCARDIOGRAM REPORT   Patient Name:   Timothy Lynch Date of Exam: 10/21/2022 Medical Rec #:  [id number]     Height:       75.0 in Accession #:    [id number]    Weight:       202.2 lb Date of Birth:  07/18/1996     BSA:          2.204 m Patient Age:    26 years      BP:           102/55 mmHg Patient Gender: M  HR:           80 bpm. Exam Location:  Inpatient Procedure: 2D Echo, Cardiac Doppler and Color Doppler Indications:    Cardiomegaly I51.7  History:        Patient has no prior history of Echocardiogram examinations.  Sonographer:    Ronny Flurry Referring Phys: [place]  1. Left ventricular ejection fraction, by estimation, is 65 to 70%. The left ventricle has normal function. The left ventricle has no regional wall motion abnormalities. Left ventricular diastolic parameters were normal.  2. Right ventricular systolic function is normal. The right ventricular size is normal.  3. The mitral valve is myxomatous. Trivial mitral valve regurgitation. No evidence of mitral stenosis.  4. The aortic valve is tricuspid. Aortic valve regurgitation is not visualized. No aortic stenosis is present.  5. Aortic dilatation noted. 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  6. The inferior vena cava is dilated in size with >50% respiratory variability, suggesting right atrial pressure of 8 mmHg. FINDINGS  Left Ventricle: Left ventricular ejection fraction, by estimation, is 65 to 70%. The left ventricle has normal function. The left ventricle has no regional wall motion abnormalities. The left ventricular internal cavity size was  normal in size. There is  no left ventricular hypertrophy. Left ventricular diastolic parameters were normal. Normal left ventricular filling pressure. Right Ventricle: The right ventricular size is normal. No increase in right ventricular wall thickness. Right ventricular systolic function is normal. Left Atrium: Left atrial size was normal in size. Right Atrium: Right atrial size was normal in size. Pericardium: There is no evidence of pericardial effusion. Mitral Valve: The mitral valve is myxomatous. Trivial mitral valve regurgitation. No evidence of mitral valve stenosis. Tricuspid Valve: The tricuspid valve is normal in structure. Tricuspid valve regurgitation is not demonstrated. No evidence of tricuspid stenosis. Aortic Valve: The aortic valve is tricuspid. Aortic valve regurgitation is not visualized. No aortic stenosis is present. Aortic valve mean gradient measures 3.0 mmHg. Aortic valve peak gradient measures 5.8 mmHg. Aortic valve area, by VTI measures 3.81 cm. Pulmonic Valve: The pulmonic valve was normal in structure. Pulmonic valve regurgitation is trivial. No evidence of pulmonic stenosis. Aorta: Aortic dilatation noted. 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 Venous: The inferior vena cava is dilated in size with greater than 50% respiratory variability, suggesting right atrial pressure of 8 mmHg. IAS/Shunts: No atrial level shunt detected by color flow Doppler.  LEFT VENTRICLE PLAX 2D LVIDd:         5.90 cm   Diastology LVIDs:         3.70 cm   LV e' medial:    11.40 cm/s LV PW:         1.00 cm   LV E/e' medial:  4.9 LV IVS:        0.90 cm   LV e' lateral:   18.50 cm/s LVOT diam:     2.50 cm   LV E/e' lateral: 3.0 LV SV:         85 LV SV Index:   38 LVOT Area:     4.91 cm  RIGHT VENTRICLE             IVC RV S prime:     12.10 cm/s  IVC diam: 2.50 cm TAPSE (M-mode): 1.8 cm LEFT ATRIUM           Index  RIGHT ATRIUM           Index LA diam:      3.80 cm 1.72 cm/m   RA  Area:     14.60 cm LA Vol (A2C): 32.5 ml 14.74 ml/m  RA Volume:   36.70 ml  16.65 ml/m LA Vol (A4C): 45.3 ml 20.55 ml/m  AORTIC VALVE AV Area (Vmax):    3.76 cm AV Area (Vmean):   3.78 cm AV Area (VTI):     3.81 cm AV Vmax:           120.00 cm/s AV Vmean:          81.400 cm/s AV VTI:            0.222 m AV Peak Grad:      5.8 mmHg AV Mean Grad:      3.0 mmHg LVOT Vmax:         91.80 cm/s LVOT Vmean:        62.700 cm/s LVOT VTI:          0.173 m LVOT/AV VTI ratio: 0.78  AORTA Ao Root diam: 4.40 cm MV E velocity: 55.40 cm/s                            SHUNTS                            Systemic VTI:  0.17 m                            Systemic Diam: 2.50 cm Chilton Si MD Electronically signed by Chilton Si MD Signature Date/Time: 10/21/2022/10:50:05 PM    Final    DG Chest Port 1 View  Result Date: 10/21/2022 CLINICAL DATA:  Influenza with endotracheal tube. EXAM: PORTABLE CHEST 1 VIEW COMPARISON:  10/19/2022 FINDINGS: 0517 hours. Unchanged endotracheal tube with tip projecting 3.0 cm above the carina. Enteric tube tip and side port project over the stomach. Improved aeration of the left upper lobe with decreased leftward mediastinal shift. Persistent retrocardiac opacity and volume loss. Small left pleural effusion. No pneumothorax. Right lung is well aerated. IMPRESSION: 1. Stable support tubes. 2. Improved aeration of the left upper lobe with persistent retrocardiac opacity and small left pleural effusion. Electronically Signed   By: Orvan Falconer M.D.   On: 10/21/2022 07:53   MR BRAIN WO CONTRAST  Result Date: 10/20/2022 CLINICAL DATA:  Seizure, new-onset, no history of trauma EXAM: MRI HEAD WITHOUT CONTRAST TECHNIQUE: Multiplanar, multiecho pulse sequences of the brain and surrounding structures were obtained without intravenous contrast. COMPARISON:  CT head 10/19/2022. FINDINGS: Brain: No acute infarction, hemorrhage, hydrocephalus, extra-axial collection or mass lesion. Unremarkable  hippocampi. Vascular: Major arterial flow voids are maintained at the skull base. Skull and upper cervical spine: Normal marrow signal. Sinuses/Orbits: Mild paranasal sinus mucosal thickening. No acute orbital findings. Other: No mastoid effusions. IMPRESSION: Normal brain MRI.  No evidence of acute intracranial abnormality. Electronically Signed   By: Feliberto Harts M.D.   On: 10/20/2022 15:36   Overnight EEG with video  Result Date: 10/20/2022 Charlsie Quest, MD     10/21/2022  9:18 AM Patient Name: Timothy Lynch MRN: [id number] Epilepsy Attending: Charlsie Quest Referring Physician/Provider: Erick Blinks, MD Duration: 10/20/2022 0148 to 10/21/2022 0148 Patient history:  27 y.o. male with PMH significant for marijuana use who  presents with 2 seizure like episodes and intubated. EEG to evaluate for seizure Level of alertness:  comatose AEDs during EEG study: LEV, propofol Technical aspects: This EEG study was done with scalp electrodes positioned according to the 10-20 International system of electrode placement. Electrical activity was reviewed with band pass filter of 1-70Hz , sensitivity of 7 uV/mm, display speed of 4930mm/sec with a 60Hz  notched filter applied as appropriate. EEG data were recorded continuously and digitally stored.  Video monitoring was available and reviewed as appropriate. Description: EEG showed continuous generalized 3 to 6 Hz theta-delta slowing with overriding 15 to 18 Hz beta activity distributed symmetrically and diffusely. Hyperventilation and photic stimulation were not performed.   ABNORMALITY - Continuous slow, generalized IMPRESSION: This study is suggestive of severe diffuse encephalopathy, nonspecific etiology but likely related to sedation. No seizures or epileptiform discharges were seen throughout the recording. Priyanka Annabelle Harman Yadav    Medications: Scheduled:  Chlorhexidine Gluconate Cloth  6 each Topical Q0600   clonazepam  1 mg Per Tube BID   docusate  100 mg  Per Tube BID   famotidine  20 mg Per Tube BID   feeding supplement (PROSource TF20)  60 mL Per Tube Daily   feeding supplement (VITAL HIGH PROTEIN)  1,000 mL Per Tube Q24H   heparin  5,000 Units Subcutaneous Q8H   levETIRAcetam  500 mg Oral BID   mouth rinse  15 mL Mouth Rinse Q2H   oseltamivir  75 mg Per Tube BID   oxyCODONE  10 mg Per Tube Q6H   polyethylene glycol  17 g Per Tube Daily   Continuous:  sodium chloride Stopped (10/22/22 0644)   cefTRIAXone (ROCEPHIN)  IV 200 mL/hr at 10/22/22 0700   fentaNYL infusion INTRAVENOUS 300 mcg/hr (10/22/22 0700)   levETIRAcetam Stopped (10/21/22 2142)   norepinephrine (LEVOPHED) Adult infusion 3 mcg/min (10/22/22 0700)   propofol (DIPRIVAN) infusion 60 mcg/kg/min (10/22/22 0700)   Assessment: 27 y.o. male with a PMH significant for marijuana use and one GTC seizure lasting approximately 4-5 minutes that occurred approximately 8 years ago for which he never sought medical attention, who presents with 2 seizure like episodes. Girlfriend reported he had a single seizure back in 2014 which was confirmed by the patient's sister. Due to severe agitation the patient required multiple doses of sedating medications in the ED, eventually necessitating intubation. MRI brain without contrast was normal and continuous EEG on Sunday only showed continuous generalized slowing with no epileptiform abnormalities. Over the course of Sunday he had become increasingly febrile up to 103.7 raising concern for meningitis.  He was started on empiric coverage with Vanco/ceftriaxone/acyclovir.  - LP was performed Sunday night, with results showing normal WBC, normal protein, clear and colorless. CSF viral PCR panel negative. Acyclovir and vancomycin discontinued on Monday.   - MRI brain without contrast: Normal brain MRI. No evidence of acute intracranial abnormality.  - EEG: - LTM EEG report for Monday AM: Continuous slow, generalized. This study is suggestive of severe  diffuse encephalopathy, nonspecific to etiology but likely related to sedation. No seizures or epileptiform discharges were seen throughout the recording. - LTM EEG report for today: Pending - UDS was positive for THC - Influenza B came back positive. CBC reveals leukocytosis. Also with possible aspiration PNA. On IV ceftriaxone.  - Overall impression: Breakthrough seizure secondary to febrile illness in a patient with one prior seizure in his lifetime. Semiology described by family is most consistent with generalized onset. Long term anticonvulsant therapy is indicated.  Recommendations: - Continue Keppra 500 mg bid during this admission and at discharge - Inpatient seizure precautions - Outpatient seizure precautions: Per [facility] statutes, patients with seizures are not allowed to drive until  they have been seizure-free for six months. Use caution when using heavy equipment or power tools. Avoid working on ladders or at heights. Take showers instead of baths. Ensure the water temperature is not too high on the home water heater. Do not go swimming alone. When caring for infants or small children, sit down when holding, feeding, or changing them to minimize risk of injury to the child in the event you have a seizure. Also, Maintain good sleep hygiene. Avoid alcohol. - Continue LTM EEG until off all sedation. Consider switching propofol to Precedex as the latter has no anticonvulsant activity. If no seizures on EEG or clinically after switching propofol to Precedex, then LTM EEG could potentially be discontinued.   35 minutes spent in the neurological evaluation and management of this critically ill patient.    LOS: 2 days    signed: Dr. Caryl Pina 10/22/2022  7:42 AM

## 2022-10-22 NOTE — Progress Notes (Signed)
Initial Nutrition Assessment  DOCUMENTATION CODES:   Not applicable  INTERVENTION:   Tube feeding via OG tube:  D/C Vital High Protein Continue ProSource TF20 for 2 more doses  Osmolite 1.5 at 25 ml/h and increase by 10 ml every 8 hours to goal rate of 65 ml/h (1560 ml per day)  Provides 2500 kcal, 137 gm protein, 1185 ml free water daily   NUTRITION DIAGNOSIS:   Inadequate oral intake related to acute illness as evidenced by NPO status.  GOAL:   Patient will meet greater than or equal to 90% of their needs  MONITOR:   TF tolerance  REASON FOR ASSESSMENT:   Consult Enteral/tube feeding initiation and management  ASSESSMENT:   Pt with PMH of seizure x 1 in 2014 and remote hx of asthma and marijuana use admitted with seizure of unknown etiology and acute hypoxemic respiratory failure in setting of flu B.   Pt discussed during ICU rounds and with RN.  Pt positive for THC on admission.   Girlfriend asleep at pt's bedside.   Medications reviewed and include: colace, pepcid, keppra, tamiflu, oxycodone, miralax, KLOR-CON 40 x 1  Fentanyl  Keppra Levophed @ 3 mcg  Kphos x 1  Propofol @ 32.8 ml/hr provides: 865 kcal   Labs reviewed:  K 3.3 ->3.5->3.4->3.2 (treated with KLOR-CON 40 mEq x 1 on 1/22) PO4: 4.4->1.6->1.6->1.7 (treated with once dose of Kphos today 1/23) TG: 217 CBG's: 78-95   OG tube; per xray in gastric fundus  NUTRITION - FOCUSED PHYSICAL EXAM: Most of exam was visual, pt agitated when touched/sitting up in bed  Flowsheet Row Most Recent Value  Orbital Region No depletion  Upper Arm Region Unable to assess  Thoracic and Lumbar Region Unable to assess  Buccal Region Unable to assess  Temple Region Unable to assess  Clavicle Bone Region Unable to assess  Clavicle and Acromion Bone Region No depletion  Scapular Bone Region Unable to assess  Dorsal Hand No depletion  Patellar Region Unable to assess  Anterior Thigh Region Unable to assess   Posterior Calf Region Unable to assess  Edema (RD Assessment) None  Hair Reviewed  Eyes Unable to assess  Mouth Unable to assess  Skin Reviewed  Nails Reviewed       Diet Order:   Diet Order             Diet NPO time specified  Diet effective now                   EDUCATION NEEDS:   Not appropriate for education at this time  Skin:  Skin Assessment: Reviewed RN Assessment  Last BM:  unknown  Height:   Ht Readings from Last 1 Encounters:  10/19/22 6\' 3"  (1.905 m)    Weight:   Wt Readings from Last 1 Encounters:  10/22/22 89.9 kg    BMI:  Body mass index is 24.77 kg/m.  Estimated Nutritional Needs:   Kcal:  2400-2600  Protein:  115-130 grams  Fluid:  >2 L/day  Lockie Pares., RD, LDN, CNSC See AMiON for contact information

## 2022-10-22 NOTE — Progress Notes (Signed)
NAME:  Timothy Lynch, MRN:  [id number], DOB:  Apr 07, 1996, LOS: 2 ADMISSION DATE:  10/19/2022 CONSULTATION DATE:  10/20/2022 REFERRING MD:  Darl Householder - EDP CHIEF COMPLAINT:  Seizure   History of Present Illness:  27 year old man who presented to [facility] ED via EMS after seizure x 2. No significant PMHx; remote history of seizure x 1 in 2014 (per girlfriend at bedside), remote history of asthma, marijuana use.  History is obtained from chart and patient's girlfriend (at bedside). States patient first had a seizure morning of admission (1/20) around 0730 without any prodromal symptoms. No recent c/o fever/chills/CP/SOB, no dizziness or HA. Patient drinks socially but no heavy drinking or daily alcohol use. Patient does use marijuana. Patient subsequently had 2 seizures throughout the course of the day, each lasting several minutes. EMS was called after patient appeared postictal and confused. On ED arrival, agitated and combative and not redirectable. Versed 10mg  IM administered by EMS and restraints utilized. Attempted CT Head with Ativan and Haldol but patient did not tolerate this due to ongoing agitation and aggression toward staff. Geodon, Ativan and Benadryl administered by EDP. Ketamine initiated. Due to need for increased sedation and airway protection, patient was intubated in ED.   In ED, mildly febrile to 100.36F, tachycardic to 100s, normotensive, SpO2 100%. Labs demonstrated CBC WNL, Na 134, K 3.2, CO2 22, Cr 0.99, Ca 8.7. AST/ALT/AP WNL, Tbili mildly elevated to 1.4. LA 0.9. APAP/salicylates negative and UDS positive for benzos/THC. Ethanol negative. UA with small Hgb, ketones, protein. Flu B positive, COVID/RSV negative. BCx pending. CXR with mild to moderate L basilar atelectasis vs infiltrate; CT Head NAICA, chronic nasal bone fx. Neuro consulted for seizures.  PCCM consulted for ICU admission.  Pertinent Medical History:  Remote history of seizure x 1 (2014), remote history of asthma, marijuana  use  Significant Hospital Events: Including procedures, antibiotic start and stop dates in addition to other pertinent events   1/20 - BIB EMS for seizures x 2 at home. LG fever to 100.36F, no other symptoms. Flu B+. Agitated/aggressive/not redirectable in ED requiring increased sedation; intubated for airway protection.  CT Head NAICA. Neuro consulted. PCCM consulted for ICU admission. 1/22 Remains intermittently agitated, sedation increased, no seizures  Interim History / Subjective:  Remains very agitated.  Sedation increased. With any stimulation agitation ensues. A bit more redirectable. EEG remains negative.   Objective:  Blood pressure 107/65, pulse 68, temperature 99.4 F (37.4 C), temperature source Bladder, resp. rate 18, height 6\' 3"  (1.905 m), weight 89.9 kg, SpO2 100 %.    Vent Mode: PRVC FiO2 (%):  [40 %] 40 % Set Rate:  [18 bmp] 18 bmp Vt Set:  [670 mL] 670 mL PEEP:  [5 cmH20] 5 cmH20 Plateau Pressure:  [17 cmH20-20 cmH20] 18 cmH20   Intake/Output Summary (Last 24 hours) at 10/22/2022 0908 Last data filed at 10/22/2022 0800 Gross per 24 hour  Intake 3493.51 ml  Output 2400 ml  Net 1093.51 ml    Filed Weights   10/20/22 0500 10/21/22 0415 10/22/22 0400  Weight: 90.3 kg 91.7 kg 89.9 kg   Physical Examination: General: Acutely ill-appearing young man in NAD.  HEENT: [place]/AT, anicteric sclera, pupils equal round  Neuro: Sedated, intermittently agitated and reaching for ETT with RUE. Withdraws to pain in all 4 extremities. Not following commands. Moves all 4 extremities spontaneously.  +Cough and +Gag  CV: RRR, no m/g/r. PULM: Breathing even and unlabored on vent (PEEP 5, FiO2 40%). Lung fields with coarse  sounds GI: Soft, nontender, nondistended. Normoactive bowel sounds. Extremities: No LE edema noted. Skin: Warm/dry, no rashes.  Resolved Hospital Problem List:    Assessment & Plan:  Seizure of unknown etiology Presented to [facility] ED via EMS for seizure x 2 at  home. Per girlfriend at bedside, remote history of 1 seizure ~2014. Not on any AEDs at home, does not follow with Neurology. No symptoms other than fevers at home. - Neuro consulted, appreciate recommendations - AEDs per Neuro (Keppra, prop, Versed PRN) - LTM EEG, no further seizures noted on propofol - MRI brain okay - Seizure precautions - Neuroprotective measures: HOB > 30 degrees, normoglycemia, normothermia, electrolytes WNL - LP 1/21, results reassuring with normal glucose, protein, low WBC  Acute hypoxemic respiratory failure in the setting of influenza B and sedation for seizure activity Remote history of asthma - Continue full vent support (4-8cc/kg IBW) - Wean FiO2 for O2 sat > 90% - Daily WUA/SBT as sedation weaning/mental status tolerates, very agitated precluding sedation wean - VAP bundle - Bronchodilators PRN - Pulmonary hygiene - PAD protocol for sedation: Propofol and Fentanyl gtt, midazolam fentanyl PRN for goal RASS -1 to -2  Influenza B infection Possible aspiration PNA Fever Tested positive for Flu B on RVP 1/20. - Tamiflu x 5 days - CTX  for CAP/aspiration coverage (?aspiration during seizure) - LP reassuring, stop acyclovir, vancomycin  Toxic/metabolic encephalopathy: Ingestion related psychosis, other underlying issue, really unclear.  High sedation needs with a lot of agitation. - Increase oral Klonopin 1 mg TID, oxycodone 15 mg every 4 hours start 1/223 - Continue propofol, fentanyl gtt. - LP reassuring   Hypotension: Sedation related. - NE, MAP goal > 65  Best Practice: (right click and "Reselect all SmartList Selections" daily)   Diet/type: NPO w/ meds via tube DVT prophylaxis: SCDs, SQH GI prophylaxis: PPI Lines: N/A Foley:  N/A Code Status:  full code Last date of multidisciplinary goals of care discussion [Pending]  Labs:  CBC: Recent Labs  Lab 10/19/22 2150 10/19/22 2233 10/20/22 0120 10/20/22 2049 10/21/22 0625  WBC 7.8  --  6.1  22.9* 12.4*  NEUTROABS 5.9  --   --  19.9*  --   HGB 14.5 13.9 14.2 14.0 12.7*  HCT 42.4 41.0 42.8 41.1 38.3*  MCV 91.8  --  93.4 94.5 94.6  PLT 204  --  197 169 157    Basic Metabolic Panel: Recent Labs  Lab 10/19/22 2150 10/19/22 2233 10/20/22 0120 10/21/22 0454 10/21/22 1412 10/21/22 1650 10/22/22 0511  NA 134* 137 135 136  --   --   --   K 3.2* 3.3* 3.5 3.4*  --   --   --   CL 100  --  102 104  --   --   --   CO2 22  --  21* 24  --   --   --   GLUCOSE 93  --  91 108*  --   --   --   BUN 9  --  10 10  --   --   --   CREATININE 0.99  --  1.02 0.95  --   --   --   CALCIUM 8.7*  --  8.2* 7.7*  --   --   --   MG  --   --  2.0 2.1 2.1 2.1 1.9  PHOS  --   --  4.4  --  1.6* 1.6* 1.7*    GFR: Estimated Creatinine Clearance: 138.4  mL/min (by C-G formula based on SCr of 0.95 mg/dL). Recent Labs  Lab 10/19/22 2150 10/19/22 2324 10/20/22 0120 10/20/22 2049 10/21/22 0625  PROCALCITON  --   --  0.16  --   --   WBC 7.8  --  6.1 22.9* 12.4*  LATICACIDVEN 0.9 0.9  --   --   --     Liver Function Tests: Recent Labs  Lab 10/19/22 2150 10/21/22 0454  AST 42* 67*  ALT 18 18  ALKPHOS 65 60  BILITOT 1.4* 4.6*  PROT 7.5 5.8*  ALBUMIN 4.1 2.9*    No results for input(s): "LIPASE", "AMYLASE" in the last 168 hours. No results for input(s): "AMMONIA" in the last 168 hours.  ABG:    Component Value Date/Time   PHART 7.336 (L) 10/19/2022 2233   PCO2ART 41.3 10/19/2022 2233   PO2ART 333 (H) 10/19/2022 2233   HCO3 21.8 10/19/2022 2233   TCO2 23 10/19/2022 2233   ACIDBASEDEF 3.0 (H) 10/19/2022 2233   O2SAT 100 10/19/2022 2233    Coagulation Profile: No results for input(s): "INR", "PROTIME" in the last 168 hours.  Cardiac Enzymes: No results for input(s): "CKTOTAL", "CKMB", "CKMBINDEX", "TROPONINI" in the last 168 hours.  HbA1C: No results found for: "HGBA1C"  CBG: Recent Labs  Lab 10/21/22 1529 10/21/22 1927 10/21/22 2319 10/22/22 0312 10/22/22 0807  GLUCAP  78 86 81 79 95    Review of Systems:   Patient is encephalopathic and/or intubated. Therefore history has been obtained from chart review.   Past Medical History:  He,  has no past medical history on file.   Surgical History:  History reviewed. No pertinent surgical history.   Social History:   reports that he has been smoking cigarettes. He has been smoking an average of .5 packs per day. He has never used smokeless tobacco. He reports current alcohol use. He reports current drug use. Frequency: 7.00 times per week. Drug: Marijuana.   Family History:  His family history is not on file.   Allergies: No Known Allergies   Home Medications: Prior to Admission medications   Not on File    Critical care time:    CRITICAL CARE Performed by: Lanier Clam   Total critical care time: 45 minutes  Critical care time was exclusive of separately billable procedures and treating other patients.  Critical care was necessary to treat or prevent imminent or life-threatening deterioration.  Critical care was time spent personally by me on the following activities: development of treatment plan with patient and/or surrogate as well as nursing, discussions with consultants, evaluation of patient's response to treatment, examination of patient, obtaining history from patient or surrogate, ordering and performing treatments and interventions, ordering and review of laboratory studies, ordering and review of radiographic studies, pulse oximetry and re-evaluation of patient's condition.   Lanier Clam, MD [facility] 10/22/22 9:08 AM  Please see [email or web address] for contact info.  From 7A-7P if no response, please call [phone] After hours, please call ELink [phone]

## 2022-10-22 NOTE — Procedures (Signed)
Patient Name: CHENG DEC  MRN: [id number]  Epilepsy Attending: Lora Havens  Referring Physician/Provider: Donnetta Simpers, MD  Duration: 10/22/2022 0148 to 10/23/2022 0148   Patient history:  27 y.o. male with PMH significant for marijuana use who presents with 2 seizure like episodes and intubated. EEG to evaluate for seizure   Level of alertness:  comatose   AEDs during EEG study: LEV, propofol   Technical aspects: This EEG study was done with scalp electrodes positioned according to the 10-20 International system of electrode placement. Electrical activity was reviewed with band pass filter of 1-70Hz , sensitivity of 7 uV/mm, display speed of 37mm/sec with a 60Hz  notched filter applied as appropriate. EEG data were recorded continuously and digitally stored.  Video monitoring was available and reviewed as appropriate.   Description: EEG showed continuous generalized 3 to 6 Hz theta-delta slowing with overriding 15 to 18 Hz beta activity distributed symmetrically and diffusely. Hyperventilation and photic stimulation were not performed.      ABNORMALITY - Continuous slow, generalized   IMPRESSION: This study is suggestive of severe diffuse encephalopathy, nonspecific etiology but likely related to sedation. No seizures or epileptiform discharges were seen throughout the recording.   Solomon Skowronek Barbra Sarks

## 2022-10-23 DIAGNOSIS — G9340 Encephalopathy, unspecified: Secondary | ICD-10-CM

## 2022-10-23 DIAGNOSIS — J101 Influenza due to other identified influenza virus with other respiratory manifestations: Secondary | ICD-10-CM

## 2022-10-23 DIAGNOSIS — J9601 Acute respiratory failure with hypoxia: Secondary | ICD-10-CM

## 2022-10-23 LAB — GLUCOSE, CAPILLARY
Glucose-Capillary: 111 mg/dL — ABNORMAL HIGH (ref 70–99)
Glucose-Capillary: 102 mg/dL — ABNORMAL HIGH (ref 70–99)
Glucose-Capillary: 116 mg/dL — ABNORMAL HIGH (ref 70–99)
Glucose-Capillary: 128 mg/dL — ABNORMAL HIGH (ref 70–99)
Glucose-Capillary: 98 mg/dL (ref 70–99)
Glucose-Capillary: 110 mg/dL — ABNORMAL HIGH (ref 70–99)

## 2022-10-23 LAB — BASIC METABOLIC PANEL
Anion gap: 7 (ref 5–15)
BUN: 7 mg/dL (ref 6–20)
CO2: 24 mmol/L (ref 22–32)
Calcium: 7.8 mg/dL — ABNORMAL LOW (ref 8.9–10.3)
Chloride: 110 mmol/L (ref 98–111)
Creatinine, Ser: 0.78 mg/dL (ref 0.61–1.24)
GFR, Estimated: >60 mL/min (ref >60)
Glucose, Bld: 126 mg/dL — ABNORMAL HIGH (ref 70–99)
Potassium: 3.7 mmol/L (ref 3.5–5.1)
Sodium: 141 mmol/L (ref 135–145)

## 2022-10-23 LAB — PHOSPHORUS
Phosphorus: 3.3 mg/dL (ref 2.5–4.6)
Phosphorus: 2.8 mg/dL (ref 2.5–4.6)

## 2022-10-23 LAB — MAGNESIUM
Magnesium: 1.8 mg/dL (ref 1.7–2.4)
Magnesium: 1.6 mg/dL — ABNORMAL LOW (ref 1.7–2.4)

## 2022-10-23 MED ORDER — HALOPERIDOL LACTATE 5 MG/ML IJ SOLN: 5.0000 mg | Freq: Once | INTRAMUSCULAR | Status: AC

## 2022-10-23 MED ORDER — HALOPERIDOL LACTATE 5 MG/ML IJ SOLN
5.0000 mg | Freq: Once | INTRAMUSCULAR | Status: AC
  Administered 2022-10-23: 5 mg via INTRAVENOUS

## 2022-10-23 MED ORDER — MIDAZOLAM HCL 2 MG/2ML IJ SOLN: 1.0000 mg | Freq: Once | INTRAMUSCULAR | Status: DC

## 2022-10-23 MED ORDER — HALOPERIDOL LACTATE 5 MG/ML IJ SOLN
INTRAMUSCULAR | Status: AC
  Filled 2022-10-23: qty 1

## 2022-10-23 MED ORDER — HALOPERIDOL LACTATE 5 MG/ML IJ SOLN
INTRAMUSCULAR | Status: AC
  Administered 2022-10-23: 5 mg via INTRAVENOUS
  Filled 2022-10-23: qty 1

## 2022-10-23 MED ORDER — HALOPERIDOL LACTATE 5 MG/ML IJ SOLN: 1.0000 mg | Freq: Four times a day (QID) | INTRAMUSCULAR | Status: DC | PRN

## 2022-10-23 MED ORDER — OLANZAPINE 5 MG PO TBDP
10.0000 mg | ORAL_TABLET | Freq: Every day | ORAL | Status: DC
  Administered 2022-10-23: 10 mg via ORAL
  Filled 2022-10-23: qty 2

## 2022-10-23 MED ORDER — POLYETHYLENE GLYCOL 3350 17 G PO PACK: 17.0000 g | PACK | Freq: Every day | ORAL | Status: DC

## 2022-10-23 MED ORDER — DEXMEDETOMIDINE HCL IN NACL 400 MCG/100ML IV SOLN
0.0000 ug/kg/h | INTRAVENOUS | Status: DC
  Administered 2022-10-23 (×5): 1.4 ug/kg/h via INTRAVENOUS
  Administered 2022-10-23: 1.2 ug/kg/h via INTRAVENOUS
  Administered 2022-10-24 (×2): 1.4 ug/kg/h via INTRAVENOUS
  Filled 2022-10-23 (×5): qty 100
  Filled 2022-10-23: qty 200

## 2022-10-23 MED ORDER — ENSURE ENLIVE PO LIQD
237.0000 mL | Freq: Three times a day (TID) | ORAL | Status: DC
  Administered 2022-10-23 – 2022-10-25 (×4): 237 mL via ORAL

## 2022-10-23 MED ORDER — MIDAZOLAM HCL 2 MG/2ML IJ SOLN: 0.5000 mg | INTRAMUSCULAR | Status: DC | PRN

## 2022-10-23 MED ORDER — DEXMEDETOMIDINE HCL IN NACL 400 MCG/100ML IV SOLN
INTRAVENOUS | Status: AC
  Filled 2022-10-23: qty 100

## 2022-10-23 MED ORDER — OSELTAMIVIR PHOSPHATE 75 MG PO CAPS
75.0000 mg | ORAL_CAPSULE | Freq: Two times a day (BID) | ORAL | Status: AC
  Administered 2022-10-23 – 2022-10-24 (×3): 75 mg via ORAL
  Filled 2022-10-23 (×4): qty 1

## 2022-10-23 NOTE — Procedures (Addendum)
Patient Name: Timothy Lynch  MRN: [id number]  Epilepsy Attending: Lora Havens  Referring Physician/Provider: Donnetta Simpers, MD  Duration: 10/23/2022 0148 to 10/23/2022 1406   Patient history:  27 y.o. male with PMH significant for marijuana use who presents with 2 seizure like episodes and intubated. EEG to evaluate for seizure   Level of alertness:  comatose-->lethargic   AEDs during EEG study: LEV   Technical aspects: This EEG study was done with scalp electrodes positioned according to the 10-20 International system of electrode placement. Electrical activity was reviewed with band pass filter of 1-70Hz , sensitivity of 7 uV/mm, display speed of 20mm/sec with a 60Hz  notched filter applied as appropriate. EEG data were recorded continuously and digitally stored.  Video monitoring was available and reviewed as appropriate.   Description: At the beginning of the study, EEG showed continuous generalized 3 to 6 Hz theta-delta slowing with overriding 15 to 18 Hz beta activity distributed symmetrically and diffusely.  Gradually EEG showed predominantly 5 to 8 Hz generalized theta-alpha activity admixed with intermittent generalized 2 to 3 Hz delta slowing.  Hyperventilation and photic stimulation were not performed.     Often, parts of study were difficult interpret due to significant electrode and movement artifact.   ABNORMALITY - Continuous slow, generalized   IMPRESSION: This study was initially suggestive of severe diffuse encephalopathy, nonspecific etiology but likely related to sedation.  Gradually, EEG improved and was suggestive of moderate diffuse encephalopathy. No seizures or epileptiform discharges were seen throughout the recording.   [name] Treat Barbra Sarks

## 2022-10-23 NOTE — Progress Notes (Signed)
Subjective: Awakens to stimulation despite heavy sedation.   Objective: Current vital signs: BP (!) 185/99   Pulse (!) 130   Temp (!) 101 F (38.3 C) (Esophageal)   Resp (!) 22   Ht 6\' 3"  (1.905 m)   Wt 90.7 kg   SpO2 99%   BMI 24.99 kg/m  Vital signs in last 24 hours: Temp:  [98.7 F (37.1 C)-101 F (38.3 C)] 101 F (38.3 C) (01/24 0800) Pulse Rate:  [66-130] 130 (01/24 0833) Resp:  [16-24] 22 (01/24 0833) BP: (94-185)/(48-99) 185/99 (01/24 0833) SpO2:  [91 %-100 %] 99 % (01/24 0816) FiO2 (%):  [40 %] 40 % (01/24 0816) Weight:  [90.7 kg] 90.7 kg (01/24 0400)  Intake/Output from previous day: 01/23 0701 - 01/24 0700 In: 4425.9 [I.V.:1815.4; NG/GT:1910.4; IV Piggyback:700.1] Out: 1825 [Urine:1825] Intake/Output this shift: Total I/O In: 168.3 [I.V.:85.8; NG/GT:65; IV Piggyback:17.6] Out: -  Nutritional status:  Diet Order             Diet NPO time specified  Diet effective now                  HEENT: [place]/AT. No nuchal rigidity Lungs: Intubated Ext: No edema   Neurologic Exam: Sedated on fentanyl at a rate of 350 and propofol at a rate of 65.  Ment: In the context of sedation, he will sit up and attend briefly to a family member who is holding him. Eyes are open.  With noxious stimuli, will flail upper extremities weakly and flex lower extremities at the hips and knees semipurposefully  CN: PERRL. Will fixate briefly on a relative's face when she speaks to him. Eyes midline with no gaze deviation. Face flaccidly symmetric.  Motor/Sensory:  With noxious stimuli, will move upper extremities weakly and flex lower extremities at the hips and knees semipurposefully. No asymmetry noted.  Reflexes: 2+ bilateral patellae. Toes equivocal.   Cerebellar/Gait: Unable to assess.  Other: No jerking, twitching or posturing noted.   Lab Results: Results for orders placed or performed during the hospital encounter of 10/19/22 (from the past 48 hour(s))  Magnesium     Status:  None   Collection Time: 10/21/22  2:12 PM  Result Value Ref Range   Magnesium 2.1 1.7 - 2.4 mg/dL    Comment: Performed at [facility], [address], [place], [place]  Phosphorus     Status: Abnormal   Collection Time: 10/21/22  2:12 PM  Result Value Ref Range   Phosphorus 1.6 (L) 2.5 - 4.6 mg/dL    Comment: Performed at [facility], [address] [place], [place]  Glucose, capillary     Status: None   Collection Time: 10/21/22  3:29 PM  Result Value Ref Range   Glucose-Capillary 78 70 - 99 mg/dL    Comment: Glucose reference range applies only to samples taken after fasting for at least 8 hours.  Magnesium     Status: None   Collection Time: 10/21/22  4:50 PM  Result Value Ref Range   Magnesium 2.1 1.7 - 2.4 mg/dL    Comment: Performed at [facility], [address], [place], [place]  Phosphorus     Status: Abnormal   Collection Time: 10/21/22  4:50 PM  Result Value Ref Range   Phosphorus 1.6 (L) 2.5 - 4.6 mg/dL    Comment: Performed at [facility], [address], [place], [place]  Glucose, capillary  Status: None   Collection Time: 10/21/22  7:27 PM  Result Value Ref Range   Glucose-Capillary 86 70 - 99 mg/dL    Comment: Glucose reference range applies only to samples taken after fasting for at least 8 hours.  Glucose, capillary     Status: None   Collection Time: 10/21/22 11:19 PM  Result Value Ref Range   Glucose-Capillary 81 70 - 99 mg/dL    Comment: Glucose reference range applies only to samples taken after fasting for at least 8 hours.  Glucose, capillary     Status: None   Collection Time: 10/22/22  3:12 AM  Result Value Ref Range   Glucose-Capillary 79 70 - 99 mg/dL    Comment: Glucose reference range applies only to samples taken after fasting for at least 8 hours.  Magnesium     Status: None   Collection Time: 10/22/22  5:11 AM  Result Value Ref Range   Magnesium 1.9 1.7 - 2.4 mg/dL     Comment: Performed at [facility], [address] [place], [place] [address]  Phosphorus     Status: Abnormal   Collection Time: 10/22/22  5:11 AM  Result Value Ref Range   Phosphorus 1.7 (L) 2.5 - 4.6 mg/dL    Comment: Performed at [facility], [address] [place], [place] [address]  Basic metabolic panel     Status: Abnormal   Collection Time: 10/22/22  5:11 AM  Result Value Ref Range   Sodium 139 135 - 145 mmol/L   Potassium 3.2 (L) 3.5 - 5.1 mmol/L   Chloride 107 98 - 111 mmol/L   CO2 22 22 - 32 mmol/L   Glucose, Bld 88 70 - 99 mg/dL    Comment: Glucose reference range applies only to samples taken after fasting for at least 8 hours.   BUN 9 6 - 20 mg/dL   Creatinine, Ser 0.980.71 0.61 - 1.24 mg/dL   Calcium 7.9 (L) 8.9 - 10.3 mg/dL   GFR, Estimated >11>60 >91>60 mL/min    Comment: (NOTE) Calculated using the CKD-EPI Creatinine Equation (2021)    Anion gap 10 5 - 15    Comment: Performed at [facility], [address] [place], [place] [address]  Glucose, capillary     Status: None   Collection Time: 10/22/22  8:07 AM  Result Value Ref Range   Glucose-Capillary 95 70 - 99 mg/dL    Comment: Glucose reference range applies only to samples taken after fasting for at least 8 hours.  Glucose, capillary     Status: None   Collection Time: 10/22/22 11:15 AM  Result Value Ref Range   Glucose-Capillary 97 70 - 99 mg/dL    Comment: Glucose reference range applies only to samples taken after fasting for at least 8 hours.  Glucose, capillary     Status: None   Collection Time: 10/22/22  3:20 PM  Result Value Ref Range   Glucose-Capillary 91 70 - 99 mg/dL    Comment: Glucose reference range applies only to samples taken after fasting for at least 8 hours.  Magnesium     Status: None   Collection Time: 10/22/22  4:38 PM  Result Value Ref Range   Magnesium 1.8 1.7 - 2.4 mg/dL    Comment: Performed at [facility], [address] [place], [place] [address]   Phosphorus     Status: Abnormal   Collection Time: 10/22/22  4:38 PM  Result Value  Ref Range   Phosphorus 1.7 (L) 2.5 - 4.6 mg/dL    Comment: Performed at [facility], [address], [place], [place] [address]  Glucose, capillary     Status: Abnormal   Collection Time: 10/22/22  7:25 PM  Result Value Ref Range   Glucose-Capillary 105 (H) 70 - 99 mg/dL    Comment: Glucose reference range applies only to samples taken after fasting for at least 8 hours.  Glucose, capillary     Status: Abnormal   Collection Time: 10/22/22 11:28 PM  Result Value Ref Range   Glucose-Capillary 107 (H) 70 - 99 mg/dL    Comment: Glucose reference range applies only to samples taken after fasting for at least 8 hours.  Glucose, capillary     Status: Abnormal   Collection Time: 10/23/22  3:24 AM  Result Value Ref Range   Glucose-Capillary 111 (H) 70 - 99 mg/dL    Comment: Glucose reference range applies only to samples taken after fasting for at least 8 hours.  Basic metabolic panel     Status: Abnormal   Collection Time: 10/23/22  4:16 AM  Result Value Ref Range   Sodium 141 135 - 145 mmol/L   Potassium 3.7 3.5 - 5.1 mmol/L   Chloride 110 98 - 111 mmol/L   CO2 24 22 - 32 mmol/L   Glucose, Bld 126 (H) 70 - 99 mg/dL    Comment: Glucose reference range applies only to samples taken after fasting for at least 8 hours.   BUN 7 6 - 20 mg/dL   Creatinine, Ser 8.67 0.61 - 1.24 mg/dL   Calcium 7.8 (L) 8.9 - 10.3 mg/dL   GFR, Estimated >67 >20 mL/min    Comment: (NOTE) Calculated using the CKD-EPI Creatinine Equation (2021)    Anion gap 7 5 - 15    Comment: Performed at [facility], [address] [place], [place] [address]  Magnesium     Status: None   Collection Time: 10/23/22  4:16 AM  Result Value Ref Range   Magnesium 1.8 1.7 - 2.4 mg/dL    Comment: Performed at [facility], [address] [place], [place] [address]  Phosphorus     Status: None   Collection Time: 10/23/22  4:16  AM  Result Value Ref Range   Phosphorus 3.3 2.5 - 4.6 mg/dL    Comment: Performed at [facility], [address], [place], [place] [address]  Glucose, capillary     Status: Abnormal   Collection Time: 10/23/22  7:43 AM  Result Value Ref Range   Glucose-Capillary 102 (H) 70 - 99 mg/dL    Comment: Glucose reference range applies only to samples taken after fasting for at least 8 hours.    Recent Results (from the past 240 hour(s))  Resp panel by RT-PCR (RSV, Flu A&B, Covid) Anterior Nasal Swab     Status: Abnormal   Collection Time: 10/19/22  9:54 PM   Specimen: Anterior Nasal Swab  Result Value Ref Range Status   SARS Coronavirus 2 by RT PCR NEGATIVE NEGATIVE Final    Comment: (NOTE) SARS-CoV-2 target nucleic acids are NOT DETECTED.  The SARS-CoV-2 RNA is generally detectable in upper respiratory specimens during the acute phase of infection. The lowest concentration of SARS-CoV-2 viral copies this assay can detect is 138 copies/mL. A negative result does not preclude SARS-Cov-2 infection and should not be used as the sole basis for treatment or other patient management  decisions. A negative result may occur with  improper specimen collection/handling, submission of specimen other than nasopharyngeal swab, presence of viral mutation(s) within the areas targeted by this assay, and inadequate number of viral copies(<138 copies/mL). A negative result must be combined with clinical observations, patient history, and epidemiological information. The expected result is Negative.  Fact Sheet for Patients:  BloggerCourse.com  Fact Sheet for Healthcare Providers:  SeriousBroker.it  This test is no t yet approved or cleared by the [place] FDA and  has been authorized for detection and/or diagnosis of SARS-CoV-2 by FDA under an Emergency Use Authorization (EUA). This EUA will remain  in effect (meaning this test can be used)  for the duration of the COVID-19 declaration under Section 564(b)(1) of the Act, 21 U.S.C.section 360bbb-3(b)(1), unless the authorization is terminated  or revoked sooner.       Influenza A by PCR NEGATIVE NEGATIVE Final   Influenza B by PCR POSITIVE (A) NEGATIVE Final    Comment: (NOTE) The Xpert Xpress SARS-CoV-2/FLU/RSV plus assay is intended as an aid in the diagnosis of influenza from Nasopharyngeal swab specimens and should not be used as a sole basis for treatment. Nasal washings and aspirates are unacceptable for Xpert Xpress SARS-CoV-2/FLU/RSV testing.  Fact Sheet for Patients: BloggerCourse.com  Fact Sheet for Healthcare Providers: SeriousBroker.it  This test is not yet approved or cleared by the [place] FDA and has been authorized for detection and/or diagnosis of SARS-CoV-2 by FDA under an Emergency Use Authorization (EUA). This EUA will remain in effect (meaning this test can be used) for the duration of the COVID-19 declaration under Section 564(b)(1) of the Act, 21 U.S.C. section 360bbb-3(b)(1), unless the authorization is terminated or revoked.     Resp Syncytial Virus by PCR NEGATIVE NEGATIVE Final    Comment: (NOTE) Fact Sheet for Patients: BloggerCourse.com  Fact Sheet for Healthcare Providers: SeriousBroker.it  This test is not yet approved or cleared by the [place] FDA and has been authorized for detection and/or diagnosis of SARS-CoV-2 by FDA under an Emergency Use Authorization (EUA). This EUA will remain in effect (meaning this test can be used) for the duration of the COVID-19 declaration under Section 564(b)(1) of the Act, 21 U.S.C. section 360bbb-3(b)(1), unless the authorization is terminated or revoked.  Performed at [facility], [address], [place], [place] [address]   Blood culture (routine x 2)     Status: None  (Preliminary result)   Collection Time: 10/19/22 11:00 PM   Specimen: BLOOD  Result Value Ref Range Status   Specimen Description BLOOD SITE NOT SPECIFIED  Final   Special Requests   Final    BOTTLES DRAWN AEROBIC AND ANAEROBIC Blood Culture adequate volume   Culture   Final    NO GROWTH 4 DAYS Performed at [facility], [address] [place], [place] [address]    Report Status PENDING  Incomplete  Blood culture (routine x 2)     Status: None (Preliminary result)   Collection Time: 10/19/22 11:24 PM   Specimen: BLOOD  Result Value Ref Range Status   Specimen Description BLOOD SITE NOT SPECIFIED  Final   Special Requests   Final    BOTTLES DRAWN AEROBIC AND ANAEROBIC Blood Culture adequate volume   Culture   Final    NO GROWTH 4 DAYS Performed at [facility], [address], [place], [place] [address]    Report Status PENDING  Incomplete  MRSA Next Gen by  PCR, Nasal     Status: None   Collection Time: 10/20/22  1:37 AM   Specimen: Nasal Mucosa; Nasal Swab  Result Value Ref Range Status   MRSA by PCR Next Gen NOT DETECTED NOT DETECTED Final    Comment: (NOTE) The GeneXpert MRSA Assay (FDA approved for NASAL specimens only), is one component of a comprehensive MRSA colonization surveillance program. It is not intended to diagnose MRSA infection nor to guide or monitor treatment for MRSA infections. Test performance is not FDA approved in patients less than 29 years old. Performed at [facility], [address] [place], [place] [address]   CSF culture w Gram Stain     Status: None (Preliminary result)   Collection Time: 10/20/22  6:29 PM   Specimen: CSF; Cerebrospinal Fluid  Result Value Ref Range Status   Specimen Description CSF  Final   Special Requests NONE  Final   Gram Stain   Final    CYTOSPIN SMEAR WBC PRESENT, PREDOMINANTLY MONONUCLEAR NO ORGANISMS SEEN    Culture   Final    NO GROWTH 3 DAYS Performed at [facility], [address] [place], [place] [address]    Report Status PENDING  Incomplete  Culture, blood (Routine X 2)     Status: None (Preliminary result)   Collection Time: 10/20/22  8:49 PM   Specimen: BLOOD  Result Value Ref Range Status   Specimen Description BLOOD SITE NOT SPECIFIED  Final   Special Requests   Final    BOTTLES DRAWN AEROBIC AND ANAEROBIC Blood Culture adequate volume   Culture   Final    NO GROWTH 3 DAYS Performed at [facility], [address] [place], [place] [address]    Report Status PENDING  Incomplete  Culture, blood (Routine X 2)     Status: None (Preliminary result)   Collection Time: 10/20/22  8:49 PM   Specimen: BLOOD  Result Value Ref Range Status   Specimen Description BLOOD SITE NOT SPECIFIED  Final   Special Requests IN PEDIATRIC BOTTLE Blood Culture adequate volume  Final   Culture   Final    NO GROWTH 3 DAYS Performed at [facility], [address] [place], [place] [address]    Report Status PENDING  Incomplete    Lipid Panel Recent Labs    10/21/22 0454  TRIG 217*    Studies/Results: ECHOCARDIOGRAM COMPLETE  Result Date: 10/21/2022    ECHOCARDIOGRAM REPORT   Patient Name:   Timothy PeaROY L Kobel Date of Exam: 10/21/2022 Medical Rec #:  [id number]     Height:       75.0 in Accession #:    [id number]    Weight:       202.2 lb Date of Birth:  Mar 23, 1996     BSA:          2.204 m Patient Age:    26 years      BP:           102/55 mmHg Patient Gender: M             HR:           80 bpm. Exam Location:  Inpatient Procedure: 2D Echo, Cardiac Doppler and Color Doppler Indications:    Cardiomegaly I51.7  History:        Patient has no prior history of Echocardiogram examinations.  Sonographer:    Lucendia HerrlichShanika Turnbull Referring Phys: [id number] STEPHANIE M REESE IMPRESSIONS  1. Left ventricular ejection fraction, by estimation, is 65 to 70%. The left ventricle has normal function. The left ventricle has no regional wall motion abnormalities. Left ventricular diastolic  parameters were normal.  2. Right ventricular systolic function is normal. The right ventricular size is normal.  3. The mitral valve is myxomatous. Trivial mitral valve regurgitation. No evidence of mitral stenosis.  4. The aortic valve is tricuspid. Aortic valve regurgitation is not visualized. No aortic stenosis is present.  5. Aortic dilatation noted. 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  6. The inferior vena cava is dilated in size with >50% respiratory variability, suggesting right atrial pressure of 8 mmHg. FINDINGS  Left Ventricle: Left ventricular ejection fraction, by estimation, is 65 to 70%. The left ventricle has normal function. The left ventricle has no regional wall motion abnormalities. The left ventricular internal cavity size was normal in size. There is  no left ventricular hypertrophy. Left ventricular diastolic parameters were normal. Normal left ventricular filling pressure. Right Ventricle: The right ventricular size is normal. No increase in right ventricular wall thickness. Right ventricular systolic function is normal. Left At[facility]: Left atrial size was normal in size. Right [facility]: Right atrial size was normal in size. Pericardium: There is no evidence of pericardial effusion. Mitral Valve: The mitral valve is myxomatous. Trivial mitral valve regurgitation. No evidence of mitral valve stenosis. Tricuspid Valve: The tricuspid valve is normal in structure. Tricuspid valve regurgitation is not demonstrated. No evidence of tricuspid stenosis. Aortic Valve: The aortic valve is tricuspid. Aortic valve regurgitation is not visualized. No aortic stenosis is present. Aortic valve mean gradient measures 3.0 mmHg. Aortic valve peak gradient measures 5.8 mmHg. Aortic valve area, by VTI measures 3.81 cm. Pulmonic Valve: The pulmonic valve was normal in structure. Pulmonic valve regurgitation is trivial. No evidence of pulmonic stenosis. Aorta: Aortic dilatation noted. There  is severe dilatation of the aortic root, measuring 44 mm. Venous: The inferior vena cava is dilated in size with greater than 50% respiratory variability, suggesting right atrial pressure of 8 mmHg. IAS/Shunts: No atrial level shunt detected by color flow Doppler.  LEFT VENTRICLE PLAX 2D LVIDd:         5.90 cm   Diastology LVIDs:         3.70 cm   LV e' medial:    11.40 cm/s LV PW:         1.00 cm   LV E/e' medial:  4.9 LV IVS:        0.90 cm   LV e' lateral:   18.50 cm/s LVOT diam:     2.50 cm   LV E/e' lateral: 3.0 LV SV:         85 LV SV Index:   38 LVOT Area:     4.91 cm  RIGHT VENTRICLE             IVC RV S prime:     12.10 cm/s  IVC diam: 2.50 cm TAPSE (M-mode): 1.8 cm LEFT ATRIUM           Index        RIGHT ATRIUM           Index LA diam:      3.80 cm 1.72 cm/m   RA Area:     14.60 cm LA Vol (A2C): 32.5 ml 14.74 ml/m  RA Volume:   36.70 ml  16.65 ml/m LA Vol (A4C): 45.3 ml 20.55 ml/m  AORTIC VALVE AV  Area (Vmax):    3.76 cm AV Area (Vmean):   3.78 cm AV Area (VTI):     3.81 cm AV Vmax:           120.00 cm/s AV Vmean:          81.400 cm/s AV VTI:            0.222 m AV Peak Grad:      5.8 mmHg AV Mean Grad:      3.0 mmHg LVOT Vmax:         91.80 cm/s LVOT Vmean:        62.700 cm/s LVOT VTI:          0.173 m LVOT/AV VTI ratio: 0.78  AORTA Ao Root diam: 4.40 cm MV E velocity: 55.40 cm/s                            SHUNTS                            Systemic VTI:  0.17 m                            Systemic Diam: 2.50 cm Chilton Siiffany [place] MD Electronically signed by Chilton Siiffany [place] MD Signature Date/Time: 10/21/2022/10:50:05 PM    Final     Medications: Scheduled:  Chlorhexidine Gluconate Cloth  6 each Topical Q0600   famotidine  20 mg Per Tube BID   haloperidol lactate       haloperidol lactate  5 mg Intravenous Once   heparin  5,000 Units Subcutaneous Q8H   levETIRAcetam  500 mg Per Tube BID   oseltamivir  75 mg Per Tube BID   polyethylene glycol  17 g Per Tube Daily   Continuous:  sodium  chloride Stopped (10/23/22 0712)   cefTRIAXone (ROCEPHIN)  IV Stopped (10/23/22 0705)   dexmedetomidine (PRECEDEX) IV infusion     levETIRAcetam       Assessment: 27 y.o. male with a PMH significant for marijuana use and one GTC seizure lasting approximately 4-5 minutes that occurred approximately 8 years ago for which he never sought medical attention, who presents with 2 seizure like episodes. Girlfriend reported he had a single seizure back in 2014 which was confirmed by the patient's sister. Due to severe agitation the patient required multiple doses of sedating medications in the ED, eventually necessitating intubation. UDS was positive for THC. LP was negative.  - Exam today is slightly improved since yesterday, despite fentanyl sedation at a rate of 350 and propofol at a rate of 65 - MRI brain without contrast: Normal brain MRI. No evidence of acute intracranial abnormality.  - EEG: - LTM EEG report for Monday AM: Continuous slow, generalized. This study is suggestive of severe diffuse encephalopathy, nonspecific to etiology but likely related to sedation. No seizures or epileptiform discharges were seen throughout the recording. - LTM EEG report for Tuesday AM: Continuous slow, generalized. This study is suggestive of severe diffuse encephalopathy, nonspecific etiology but likely related to sedation. No seizures or epileptiform discharges were seen throughout the recording. - LTM EEG report for today: Pending - Influenza B came back positive. CBC reveals leukocytosis. Also with possible aspiration PNA. On IV ceftriaxone.  - Overall impression: Breakthrough seizure secondary to febrile illness in a patient with one prior seizure in his lifetime. Semiology described by family  is most consistent with generalized onset. Long term anticonvulsant therapy is indicated.      Recommendations: - Continue Keppra 500 mg bid during this admission and at discharge - Inpatient seizure precautions -  Outpatient seizure precautions: Per [facility] statutes, patients with seizures are not allowed to drive until  they have been seizure-free for six months. Use caution when using heavy equipment or power tools. Avoid working on ladders or at heights. Take showers instead of baths. Ensure the water temperature is not too high on the home water heater. Do not go swimming alone. When caring for infants or small children, sit down when holding, feeding, or changing them to minimize risk of injury to the child in the event you have a seizure. Also, Maintain good sleep hygiene. Avoid alcohol. - Continue LTM EEG until off all sedation. Consider switching propofol to Precedex as the latter has no anticonvulsant activity. If no seizures on EEG or clinically after switching propofol to Precedex, then LTM EEG could potentially be discontinued.   35 minutes spent in the neurological evaluation and management of this critically ill patient.    LOS: 3 days   @Electronically  signed: Dr. Kerney Elbe 10/23/2022  8:52 AM

## 2022-10-23 NOTE — Procedures (Signed)
Extubation Procedure Note  Patient Details:   Name: Timothy Lynch DOB: August 06, 1996 MRN: [id number]   Airway Documentation:  Airway 7.5 mm (Active)  Secured at (cm) 27 cm 10/23/22 0753  Measured From Lips 10/23/22 0753  Secured Location Right 10/23/22 0753  Secured By [employer] 10/23/22 0753  Tube Holder Repositioned Yes 10/23/22 0753  Prone position No 10/23/22 0315  Cuff Pressure (cm H2O) Green OR 18-26 [facility] 10/23/22 0753  Site Condition Dry 10/23/22 0753   Vent end date: (not recorded) Vent end time: (not recorded)   Evaluation  O2 sats: stable throughout Complications: Complications of pt combative  Patient did tolerate procedure well. Bilateral Breath Sounds: Clear, Diminished   Yes  [name] 10/23/2022, 8:34 AM  Pt extubated to 6 L nasal cannula after combative extubation, positive leak test, RN present

## 2022-10-23 NOTE — Progress Notes (Signed)
vLTM discontinued.  No skin breakdown noted at all skin sites.  [facility] notified 

## 2022-10-23 NOTE — Progress Notes (Signed)
Nutrition Follow-up  DOCUMENTATION CODES:   Not applicable  INTERVENTION:   Reviewed menu ordering and alternates with pt/girlfriend. Encouraged PO intake  Ensure Enlive po TID, each supplement provides 350 kcal and 20 grams of protein.   NUTRITION DIAGNOSIS:   Inadequate oral intake related to acute illness as evidenced by NPO status. Ongoing.   GOAL:   Patient will meet greater than or equal to 90% of their needs Progressing with diet advancement.   MONITOR:   TF tolerance  REASON FOR ASSESSMENT:   Consult Enteral/tube feeding initiation and management  ASSESSMENT:   Pt with PMH of seizure x 1 in 2014 and remote hx of asthma and marijuana use admitted with seizure of unknown etiology and acute hypoxemic respiratory failure in setting of flu B.   Pt discussed during ICU rounds and with RN.  Spoke with pt and his girlfriend. Pt extubated this am. Pt has not ate any food, but has drank fluids no problem. Pt agreeable to ensure.   Medications reviewed and include: keppra, tamiflu, oxycodone, miralax  Precedex  Labs reviewed:  PO4: 4.4->1.6->1.6->1.7 -> 3.3    Diet Order:   Diet Order             Diet regular Room service appropriate? Yes with Assist; Fluid consistency: Thin  Diet effective now                   EDUCATION NEEDS:   Not appropriate for education at this time  Skin:  Skin Assessment: Reviewed RN Assessment  Last BM:  unknown  Height:   Ht Readings from Last 1 Encounters:  10/19/22 6\' 3"  (1.905 m)    Weight:   Wt Readings from Last 1 Encounters:  10/23/22 90.7 kg    BMI:  Body mass index is 24.99 kg/m.  Estimated Nutritional Needs:   Kcal:  2400-2600  Protein:  115-130 grams  Fluid:  >2 L/day  Lockie Pares., RD, LDN, CNSC See AMiON for contact information

## 2022-10-23 NOTE — Progress Notes (Addendum)
Pt noted to have some linear abrasions located near his restraint on his right wrist, but mostly in reference to pt's I.D. bracelet. These abrasions are scabbed and not open. After speaking w/ pt's girlfriend, she states that these abrasion in multiple stages of healing which also are on bilat wrist, are for sure not related at all to pt's non-violent wrist restraints, but are the result of hand-cuffs placed on pt's wrist prior to 4N ICU admission. Probably at the time pt was combative and confused, which why he is admitted.

## 2022-10-23 NOTE — Progress Notes (Signed)
150 mL fentanyl wasted in stericycle waste bin with Geoffry Paradise.

## 2022-10-23 NOTE — Progress Notes (Addendum)
NAME:  Timothy Lynch, MRN:  [id number], DOB:  [birth year], LOS: 3 ADMISSION DATE:  10/19/2022 CONSULTATION DATE:  10/20/2022 REFERRING MD:  Darl Householder - EDP CHIEF COMPLAINT:  Seizure   History of Present Illness:  27 year old man who presented to [facility] ED via EMS after seizure x 2. No significant PMHx; remote history of seizure x 1 in 2014 (per girlfriend at bedside), remote history of asthma, marijuana use.  History is obtained from chart and patient's girlfriend (at bedside). States patient first had a seizure morning of admission (1/20) around 0730 without any prodromal symptoms. No recent c/o fever/chills/CP/SOB, no dizziness or HA. Patient drinks socially but no heavy drinking or daily alcohol use. Patient does use marijuana. Patient subsequently had 2 seizures throughout the course of the day, each lasting several minutes. EMS was called after patient appeared postictal and confused. On ED arrival, agitated and combative and not redirectable. Versed 10mg  IM administered by EMS and restraints utilized. Attempted CT Head with Ativan and Haldol but patient did not tolerate this due to ongoing agitation and aggression toward staff. Geodon, Ativan and Benadryl administered by EDP. Ketamine initiated. Due to need for increased sedation and airway protection, patient was intubated in ED.   In ED, mildly febrile to 100.35F, tachycardic to 100s, normotensive, SpO2 100%. Labs demonstrated CBC WNL, Na 134, K 3.2, CO2 22, Cr 0.99, Ca 8.7. AST/ALT/AP WNL, Tbili mildly elevated to 1.4. LA 0.9. APAP/salicylates negative and UDS positive for benzos/THC. Ethanol negative. UA with small Hgb, ketones, protein. Flu B positive, COVID/RSV negative. BCx pending. CXR with mild to moderate L basilar atelectasis vs infiltrate; CT Head NAICA, chronic nasal bone fx. Neuro consulted for seizures.  PCCM consulted for ICU admission.  Pertinent Medical History:  Remote history of seizure x 1 (2014), remote history of asthma, marijuana  use  Significant Hospital Events: Including procedures, antibiotic start and stop dates in addition to other pertinent events   1/20 - BIB EMS for seizures x 2 at home. LG fever to 100.35F, no other symptoms. Flu B+. Agitated/aggressive/not redirectable in ED requiring increased sedation; intubated for airway protection.  CT Head NAICA. Neuro consulted. PCCM consulted for ICU admission. 1/22 Remains intermittently agitated, sedation increased, no seizures  Interim History / Subjective:  Agitated but not fully following commands. EEG 1/23 without seizures. Discussed with neuro who are OK with [place] weaning sedation and attempted SBT/extubation.  Objective:  Blood pressure (!) 129/93, pulse 81, temperature (!) 100.7 F (38.2 C), temperature source Bladder, resp. rate 18, height 6\' 3"  (1.905 m), weight 90.7 kg, SpO2 100 %.    Vent Mode: PRVC FiO2 (%):  [40 %] 40 % Set Rate:  [18 bmp] 18 bmp Vt Set:  [670 mL] 670 mL PEEP:  [5 cmH20] 5 cmH20 Plateau Pressure:  [17 cmH20-21 cmH20] 20 cmH20   Intake/Output Summary (Last 24 hours) at 10/23/2022 0806 Last data filed at 10/23/2022 0700 Gross per 24 hour  Intake 4257.21 ml  Output 1825 ml  Net 2432.21 ml    Filed Weights   10/21/22 0415 10/22/22 0400 10/23/22 0400  Weight: 91.7 kg 89.9 kg 90.7 kg   Physical Examination: General: Young adult male, resting in bed, in NAD. Neuro: Agitated but not following commands. HEENT: [place]/AT. Sclerae anicteric. ETT in place. Cardiovascular: RRR, no M/R/G.  Lungs: Respirations even and unlabored.  CTA bilaterally, No W/R/R. Abdomen: BS x 4, soft, NT/ND.  Musculoskeletal: No gross deformities, no edema.  Skin: Intact, warm, no rashes.   Assessment &  Plan:   Seizure of unknown etiology - MRI negative. Does have hx of THC use (UDS also positive). No neuro symptoms recently besides fevers at home. LP 1/21 was negative. Metabolic Encephalopathy - Neuro following, appreciate recommendations - AEDs per  Neuro (Keppra 500mg ) - Seizure precautions - Avoid sedating meds - Will need outpatient follow up  Acute hypoxemic respiratory failure in the setting of influenza B, Possible aspiration, and sedation for seizure activity Remote history of asthma - PSV wean now - Plan to extubate - Start Precedex for agitation - Bronchial hygiene - Ambulate once up - Supportive care, continue Tamiflu - Continue CTX through 1/24 for 5 days total abx  Nutrition - TF on hold for hopeful extubation - Bedside swallow post extubation  Monitor in ICU post extubation. If stable, transfer out to floor later this afternoon and potential d/c home 1/25 or 1/26.   Best Practice: (right click and "Reselect all SmartList Selections" daily)   Diet/type: NPO w/ meds via tube DVT prophylaxis: SCDs, SQH GI prophylaxis: PPI Lines: N/A Foley:  N/A Code Status:  full code Last date of multidisciplinary goals of care discussion [Pending]   CC time: 30 min.   Montey Hora, [place] For pager details, please see AMION or use Epic chat  After 1900, please call [facility] for cross coverage needs 10/23/2022, 8:20 AM

## 2022-10-24 ENCOUNTER — Other Ambulatory Visit: Payer: Self-pay

## 2022-10-24 ENCOUNTER — Inpatient Hospital Stay (HOSPITAL_COMMUNITY): Payer: Self-pay

## 2022-10-24 DIAGNOSIS — R451 Restlessness and agitation: Secondary | ICD-10-CM

## 2022-10-24 LAB — CSF CULTURE W GRAM STAIN: Culture: NO GROWTH

## 2022-10-24 LAB — CULTURE, BLOOD (ROUTINE X 2)
Culture: NO GROWTH
Culture: NO GROWTH
Special Requests: ADEQUATE
Special Requests: ADEQUATE

## 2022-10-24 LAB — GLUCOSE, CAPILLARY
Glucose-Capillary: 102 mg/dL — ABNORMAL HIGH (ref 70–99)
Glucose-Capillary: 109 mg/dL — ABNORMAL HIGH (ref 70–99)
Glucose-Capillary: 121 mg/dL — ABNORMAL HIGH (ref 70–99)
Glucose-Capillary: 121 mg/dL — ABNORMAL HIGH (ref 70–99)
Glucose-Capillary: 134 mg/dL — ABNORMAL HIGH (ref 70–99)

## 2022-10-24 LAB — PHOSPHORUS: Phosphorus: 2.9 mg/dL (ref 2.5–4.6)

## 2022-10-24 LAB — MAGNESIUM: Magnesium: 1.7 mg/dL (ref 1.7–2.4)

## 2022-10-24 MED ORDER — LEVETIRACETAM 500 MG PO TABS
500.0000 mg | ORAL_TABLET | Freq: Two times a day (BID) | ORAL | Status: DC
  Administered 2022-10-24 – 2022-10-25 (×3): 500 mg via ORAL
  Filled 2022-10-24 (×3): qty 1

## 2022-10-24 MED ORDER — MAGNESIUM SULFATE 2 GM/50ML IV SOLN
2.0000 g | Freq: Once | INTRAVENOUS | Status: AC
  Administered 2022-10-24: 2 g via INTRAVENOUS
  Filled 2022-10-24: qty 50

## 2022-10-24 MED ORDER — ALPRAZOLAM 0.5 MG PO TABS: 0.5000 mg | ORAL_TABLET | Freq: Three times a day (TID) | ORAL | Status: DC | PRN

## 2022-10-24 NOTE — Progress Notes (Signed)
Subjective: Extubated yesterday morning.   Objective: Current vital signs: BP (!) 145/85   Pulse (!) 163   Temp 98.5 F (36.9 C) (Axillary)   Resp (!) 51   Ht 6\' 3"  (1.905 m)   Wt 90.7 kg   SpO2 99%   BMI 24.99 kg/m  Vital signs in last 24 hours: Temp:  [98.5 F (36.9 C)-101.7 F (38.7 C)] 98.5 F (36.9 C) (01/25 0400) Pulse Rate:  [81-163] 163 (01/25 0700) Resp:  [15-51] 51 (01/25 0700) BP: (119-185)/(74-121) 145/85 (01/25 0700) SpO2:  [90 %-100 %] 99 % (01/25 0700) FiO2 (%):  [40 %] 40 % (01/24 0816) Weight:  [90.7 kg] 90.7 kg (01/25 0500)  Intake/Output from previous day: 01/24 0701 - 01/25 0700 In: 1207.3 [I.V.:777; NG/GT:130; IV Piggyback:300.3] Out: 650 [Urine:650] Intake/Output this shift: No intake/output data recorded. Nutritional status:  Diet Order             Diet regular Room service appropriate? Yes with Assist; Fluid consistency: Thin  Diet effective now                  HEENT: [place]/AT Lungs: Respirations unlabored Ext: No edema  Neurologic Exam: Ment: Awake with decreased level of alertness. Fully oriented. Speech fluent with intact comprehension.  CN: PERRL. EOMI. Face symmetric. Some hoarseness noted.  Motor: 5/5 x 4 Sensory: Grossly intact to touch x 4 Cerebellar: Non-cooperative.  Gait: Deferred  Lab Results: Results for orders placed or performed during the hospital encounter of 10/19/22 (from the past 48 hour(s))  Glucose, capillary     Status: None   Collection Time: 10/22/22  8:07 AM  Result Value Ref Range   Glucose-Capillary 95 70 - 99 mg/dL    Comment: Glucose reference range applies only to samples taken after fasting for at least 8 hours.  Glucose, capillary     Status: None   Collection Time: 10/22/22 11:15 AM  Result Value Ref Range   Glucose-Capillary 97 70 - 99 mg/dL    Comment: Glucose reference range applies only to samples taken after fasting for at least 8 hours.  Glucose, capillary     Status: None   Collection  Time: 10/22/22  3:20 PM  Result Value Ref Range   Glucose-Capillary 91 70 - 99 mg/dL    Comment: Glucose reference range applies only to samples taken after fasting for at least 8 hours.  Magnesium     Status: None   Collection Time: 10/22/22  4:38 PM  Result Value Ref Range   Magnesium 1.8 1.7 - 2.4 mg/dL    Comment: Performed at [facility], [address] [place], [place]  Phosphorus     Status: Abnormal   Collection Time: 10/22/22  4:38 PM  Result Value Ref Range   Phosphorus 1.7 (L) 2.5 - 4.6 mg/dL    Comment: Performed at [facility], [address], [place], [place]  Glucose, capillary     Status: Abnormal   Collection Time: 10/22/22  7:25 PM  Result Value Ref Range   Glucose-Capillary 105 (H) 70 - 99 mg/dL    Comment: Glucose reference range applies only to samples taken after fasting for at least 8 hours.  Glucose, capillary     Status: Abnormal   Collection Time: 10/22/22 11:28 PM  Result Value Ref Range   Glucose-Capillary 107 (H) 70 - 99 mg/dL    Comment: Glucose reference range applies only to samples taken after fasting for at  least 8 hours.  Glucose, capillary     Status: Abnormal   Collection Time: 10/23/22  3:24 AM  Result Value Ref Range   Glucose-Capillary 111 (H) 70 - 99 mg/dL    Comment: Glucose reference range applies only to samples taken after fasting for at least 8 hours.  Basic metabolic panel     Status: Abnormal   Collection Time: 10/23/22  4:16 AM  Result Value Ref Range   Sodium 141 135 - 145 mmol/L   Potassium 3.7 3.5 - 5.1 mmol/L   Chloride 110 98 - 111 mmol/L   CO2 24 22 - 32 mmol/L   Glucose, Bld 126 (H) 70 - 99 mg/dL    Comment: Glucose reference range applies only to samples taken after fasting for at least 8 hours.   BUN 7 6 - 20 mg/dL   Creatinine, Ser 9.89 0.61 - 1.24 mg/dL   Calcium 7.8 (L) 8.9 - 10.3 mg/dL   GFR, Estimated >21 >19 mL/min    Comment: (NOTE) Calculated using the CKD-EPI Creatinine  Equation (2021)    Anion gap 7 5 - 15    Comment: Performed at [facility], [address] [place], [place] [address]  Magnesium     Status: None   Collection Time: 10/23/22  4:16 AM  Result Value Ref Range   Magnesium 1.8 1.7 - 2.4 mg/dL    Comment: Performed at [facility] [place] [facility], [address], [place], [place] [address]  Phosphorus     Status: None   Collection Time: 10/23/22  4:16 AM  Result Value Ref Range   Phosphorus 3.3 2.5 - 4.6 mg/dL    Comment: Performed at [facility], [address], [place], [place] [address]  Glucose, capillary     Status: Abnormal   Collection Time: 10/23/22  7:43 AM  Result Value Ref Range   Glucose-Capillary 102 (H) 70 - 99 mg/dL    Comment: Glucose reference range applies only to samples taken after fasting for at least 8 hours.  Glucose, capillary     Status: Abnormal   Collection Time: 10/23/22 11:22 AM  Result Value Ref Range   Glucose-Capillary 116 (H) 70 - 99 mg/dL    Comment: Glucose reference range applies only to samples taken after fasting for at least 8 hours.  Glucose, capillary     Status: Abnormal   Collection Time: 10/23/22  3:04 PM  Result Value Ref Range   Glucose-Capillary 128 (H) 70 - 99 mg/dL    Comment: Glucose reference range applies only to samples taken after fasting for at least 8 hours.  Magnesium     Status: Abnormal   Collection Time: 10/23/22  4:27 PM  Result Value Ref Range   Magnesium 1.6 (L) 1.7 - 2.4 mg/dL    Comment: Performed at [facility], [address] [place], [place] [address]  Phosphorus     Status: None   Collection Time: 10/23/22  4:27 PM  Result Value Ref Range   Phosphorus 2.8 2.5 - 4.6 mg/dL    Comment: Performed at [facility], [address] [place], [place] [address]  Glucose, capillary     Status: None   Collection Time: 10/23/22  7:18 PM  Result Value Ref Range   Glucose-Capillary 98 70 - 99 mg/dL    Comment: Glucose reference range applies only to samples  taken after fasting for at least 8 hours.  Glucose, capillary  Status: Abnormal   Collection Time: 10/23/22 11:28 PM  Result Value Ref Range   Glucose-Capillary 110 (H) 70 - 99 mg/dL    Comment: Glucose reference range applies only to samples taken after fasting for at least 8 hours.  Glucose, capillary     Status: Abnormal   Collection Time: 10/24/22  3:20 AM  Result Value Ref Range   Glucose-Capillary 109 (H) 70 - 99 mg/dL    Comment: Glucose reference range applies only to samples taken after fasting for at least 8 hours.  Magnesium     Status: None   Collection Time: 10/24/22  4:42 AM  Result Value Ref Range   Magnesium 1.7 1.7 - 2.4 mg/dL    Comment: Performed at [facility], [address] [place], [place] [address]  Phosphorus     Status: None   Collection Time: 10/24/22  4:42 AM  Result Value Ref Range   Phosphorus 2.9 2.5 - 4.6 mg/dL    Comment: Performed at [facility], [address] [place], [place] [address]    Recent Results (from the past 240 hour(s))  Resp panel by RT-PCR (RSV, Flu A&B, Covid) Anterior Nasal Swab     Status: Abnormal   Collection Time: 10/19/22  9:54 PM   Specimen: Anterior Nasal Swab  Result Value Ref Range Status   SARS Coronavirus 2 by RT PCR NEGATIVE NEGATIVE Final    Comment: (NOTE) SARS-CoV-2 target nucleic acids are NOT DETECTED.  The SARS-CoV-2 RNA is generally detectable in upper respiratory specimens during the acute phase of infection. The lowest concentration of SARS-CoV-2 viral copies this assay can detect is 138 copies/mL. A negative result does not preclude SARS-Cov-2 infection and should not be used as the sole basis for treatment or other patient management decisions. A negative result may occur with  improper specimen collection/handling, submission of specimen other than nasopharyngeal swab, presence of viral mutation(s) within the areas targeted by this assay, and inadequate number of viral copies(<138  copies/mL). A negative result must be combined with clinical observations, patient history, and epidemiological information. The expected result is Negative.  Fact Sheet for Patients:  BloggerCourse.com  Fact Sheet for Healthcare Providers:  SeriousBroker.it  This test is no t yet approved or cleared by the [place] FDA and  has been authorized for detection and/or diagnosis of SARS-CoV-2 by FDA under an Emergency Use Authorization (EUA). This EUA will remain  in effect (meaning this test can be used) for the duration of the COVID-19 declaration under Section 564(b)(1) of the Act, 21 U.S.C.section 360bbb-3(b)(1), unless the authorization is terminated  or revoked sooner.       Influenza A by PCR NEGATIVE NEGATIVE Final   Influenza B by PCR POSITIVE (A) NEGATIVE Final    Comment: (NOTE) The Xpert Xpress SARS-CoV-2/FLU/RSV plus assay is intended as an aid in the diagnosis of influenza from Nasopharyngeal swab specimens and should not be used as a sole basis for treatment. Nasal washings and aspirates are unacceptable for Xpert Xpress SARS-CoV-2/FLU/RSV testing.  Fact Sheet for Patients: BloggerCourse.com  Fact Sheet for Healthcare Providers: SeriousBroker.it  This test is not yet approved or cleared by the [place] FDA and has been authorized for detection and/or diagnosis of SARS-CoV-2 by FDA under an Emergency Use Authorization (EUA). This EUA will remain in effect (meaning this test can be used) for the duration of the COVID-19 declaration under Section 564(b)(1) of the Act, 21 U.S.C. section 360bbb-3(b)(1), unless the authorization is  terminated or revoked.     Resp Syncytial Virus by PCR NEGATIVE NEGATIVE Final    Comment: (NOTE) Fact Sheet for Patients: EntrepreneurPulse.com.au  Fact Sheet for Healthcare  Providers: IncredibleEmployment.be  This test is not yet approved or cleared by the [place] FDA and has been authorized for detection and/or diagnosis of SARS-CoV-2 by FDA under an Emergency Use Authorization (EUA). This EUA will remain in effect (meaning this test can be used) for the duration of the COVID-19 declaration under Section 564(b)(1) of the Act, 21 U.S.C. section 360bbb-3(b)(1), unless the authorization is terminated or revoked.  Performed at [facility], [place] [address] [place], [place] [address]   Blood culture (routine x 2)     Status: None   Collection Time: 10/19/22 11:00 PM   Specimen: BLOOD  Result Value Ref Range Status   Specimen Description BLOOD SITE NOT SPECIFIED  Final   Special Requests   Final    BOTTLES DRAWN AEROBIC AND ANAEROBIC Blood Culture adequate volume   Culture   Final    NO GROWTH 5 DAYS Performed at [facility], [address] [place], [place] [address]    Report Status 10/24/2022 FINAL  Final  Blood culture (routine x 2)     Status: None   Collection Time: 10/19/22 11:24 PM   Specimen: BLOOD  Result Value Ref Range Status   Specimen Description BLOOD SITE NOT SPECIFIED  Final   Special Requests   Final    BOTTLES DRAWN AEROBIC AND ANAEROBIC Blood Culture adequate volume   Culture   Final    NO GROWTH 5 DAYS Performed at [facility], [place] [address] [place], [place] [address]    Report Status 10/24/2022 FINAL  Final  MRSA Next Gen by PCR, Nasal     Status: None   Collection Time: 10/20/22  1:37 AM   Specimen: Nasal Mucosa; Nasal Swab  Result Value Ref Range Status   MRSA by PCR Next Gen NOT DETECTED NOT DETECTED Final    Comment: (NOTE) The GeneXpert MRSA Assay (FDA approved for NASAL specimens only), is one component of a comprehensive MRSA colonization surveillance program. It is not intended to diagnose MRSA infection nor to guide or monitor treatment for MRSA infections. Test  performance is not FDA approved in patients less than 77 years old. Performed at [facility], [place] [address], [place], [place] [address]   CSF culture w Gram Stain     Status: None (Preliminary result)   Collection Time: 10/20/22  6:29 PM   Specimen: CSF; Cerebrospinal Fluid  Result Value Ref Range Status   Specimen Description CSF  Final   Special Requests NONE  Final   Gram Stain   Final    CYTOSPIN SMEAR WBC PRESENT, PREDOMINANTLY MONONUCLEAR NO ORGANISMS SEEN    Culture   Final    NO GROWTH 3 DAYS Performed at [facility], [place] [address], [place],  [address]    Report Status PENDING  Incomplete  Culture, blood (Routine X 2)     Status: None (Preliminary result)   Collection Time: 10/20/22  8:49 PM   Specimen: BLOOD  Result Value Ref Range Status   Specimen Description BLOOD SITE NOT SPECIFIED  Final   Special Requests   Final    BOTTLES DRAWN AEROBIC AND ANAEROBIC Blood Culture adequate volume   Culture   Final    NO GROWTH 4 DAYS Performed at [facility],  [address] [place], [place] [address]    Report Status PENDING  Incomplete  Culture, blood (Routine X 2)     Status: None (Preliminary result)   Collection Time: 10/20/22  8:49 PM   Specimen: BLOOD  Result Value Ref Range Status   Specimen Description BLOOD SITE NOT SPECIFIED  Final   Special Requests IN PEDIATRIC BOTTLE Blood Culture adequate volume  Final   Culture   Final    NO GROWTH 4 DAYS Performed at [facility], [address] [place], [place] [address]    Report Status PENDING  Incomplete    Lipid Panel No results for input(s): "CHOL", "TRIG", "HDL", "CHOLHDL", "VLDL", "LDLCALC" in the last 72 hours.  Studies/Results: No results found.  Medications: Scheduled:  Chlorhexidine Gluconate Cloth  6 each Topical Q0600   feeding supplement  237 mL Oral TID BM   heparin  5,000 Units Subcutaneous Q8H   levETIRAcetam  500 mg Oral BID   oseltamivir  75 mg Oral BID    polyethylene glycol  17 g Oral Daily   Continuous:  sodium chloride Stopped (10/23/22 0712)   cefTRIAXone (ROCEPHIN)  IV 200 mL/hr at 10/24/22 0700   dexmedetomidine (PRECEDEX) IV infusion 1.1 mcg/kg/hr (10/24/22 0700)   magnesium sulfate bolus IVPB      Assessment: 27 y.o. male with a PMH significant for marijuana use and one GTC seizure lasting approximately 4-5 minutes that occurred approximately 8 years ago for which he never sought medical attention, who presents with 2 seizure like episodes. Girlfriend reported he had a single seizure back in 2014 which was confirmed by the patient's sister. Due to severe agitation the patient required multiple doses of sedating medications in the ED, eventually necessitating intubation. UDS was positive for THC. LP was negative.  - Exam today improved in the context of interval extubation. Speech intact and able to follow all commands. No focal weakness noted. No further seizures.  - MRI brain without contrast: Normal brain MRI. No evidence of acute intracranial abnormality.  - EEG: - LTM EEG report for Monday AM: Continuous slow, generalized. This study is suggestive of severe diffuse encephalopathy, nonspecific to etiology but likely related to sedation. No seizures or epileptiform discharges were seen throughout the recording. - LTM EEG report for Tuesday AM: Continuous slow, generalized. This study is suggestive of severe diffuse encephalopathy, nonspecific etiology but likely related to sedation. No seizures or epileptiform discharges were seen throughout the recording. - LTM EEG report for today: Continuous slow, generalized. This study was initially suggestive of severe diffuse encephalopathy, nonspecific to etiology but likely related to sedation.  Gradually, EEG improved and was suggestive of moderate diffuse encephalopathy. No seizures or epileptiform discharges were seen throughout the recording. - Influenza B positive. CBC revealed leukocytosis.  Also with possible aspiration PNA. On IV ceftriaxone.  - Overall impression: Breakthrough seizure secondary to febrile illness in a patient with one prior seizure in his lifetime. Semiology described by family is most consistent with generalized onset. Long term anticonvulsant therapy is indicated.      Recommendations: - Continue Keppra 500 mg bid during this admission and at discharge - Inpatient seizure precautions - LTM EEG has been discontinued - Outpatient seizure precautions: Per [facility] statutes, patients with seizures are not allowed to drive until  they have been seizure-free for six months. Use caution when using heavy equipment or power tools. Avoid working on ladders or at heights. Take showers instead of baths. Ensure the water  temperature is not too high on the home water heater. Do not go swimming alone. When caring for infants or small children, sit down when holding, feeding, or changing them to minimize risk of injury to the child in the event you have a seizure. Also, Maintain good sleep hygiene. Avoid alcohol. - Neurohospitalist service will sign off. Please call if there are additional questions.  - Outpatient Neurology follow up.    LOS: 4 days   @Electronically  signed: Dr. Kerney Elbe 10/24/2022  7:47 AM

## 2022-10-24 NOTE — Progress Notes (Signed)
Pt still requiring O2 via [place] to maintain saturations greater that 92. Attempted RA trial but unsuccessful. SPO2 ranged from 87 to 92. [place] at 4L resumed. MD made aware.

## 2022-10-24 NOTE — Discharge Summary (Signed)
Physician Discharge Summary   Patient ID: Timothy Lynch MRN: [id number] DOB/AGE: June 01, 1996 27 y.o.  Admit date: 10/19/2022 Discharge date: 10/25/2022                     Discharge Plan by Diagnosis   Seizure of unknown etiology, felt to possibly be breakthrough seizures 2/2 febrile illness. CT head, MRI brain, LP negative. UDS positive for [facility] which he admits to using chronically. Metabolic Encephalopathy - 2/2 above. - Continue Keppra 500mg  BID indefinitely. - Please call [facility] [phone]) for a follow up appointment in the next 2 weeks. - Seizure precautions: Per [facility] statutes, patients with seizures are not allowed to drive until  they have been seizure-free for six months. Use caution when using heavy equipment or power tools. Avoid working on ladders or at heights. Take showers instead of baths. Ensure the water temperature is not too high on the home water heater. Do not go swimming alone. When caring for infants or small children, sit down when holding, feeding, or changing them to minimize risk of injury to the child in the event you have a seizure. Also, Maintain good sleep hygiene. Avoid alcohol. - Avoid THC/substance use/EtOH.    Discharge Summary  Timothy Lynch is a 27 y.o. y/o male with a PMH of asthma, THC use and remote seizure in 2014. He presented to [facility] ED 1/20 after a seizure at home. He had apparently had a seizure earlier that day that resolved spontaneously then had 2 more later in the day, each one lasting several minutes. EMS was called after he remained confused and post-ictal.  On ED arrival, he was extremely agitated and combative and ultimately required intubation. His workup including CT head, MRI brain, and LP was essentially negative; however, flu B was positive. UDS positive for THC. LTM showed moderate diffuse encephalopathy without epileptiform activity.   He was liberated from the ventilator on 1/24; however, he became extremely  agitated thereafter and required Precedex infusion and Haldol PRN. His Precedex was weaned off AM 1/25. He had fever to 101.7 overnight but resolved later that day. He continued to require 2 - 4L O2 via [place] and when this was removed, he desaturated to the 80s. Later that evening, saturations improved and he was weaned off O2 completely. He did have noteable bloody sputum and epistaxis following extubation and this had resolved completely by 1/25.  He was treated with 5 days Ceftriaxone for possible CAP/aspiration as well as 5 days of Tamiflu.  Later afternoon 1/25, he was transferred out of the ICU to the floor while home O2 etc was being arranged.  On 1/26, he was deemed medically stable and was cleared for discharge home.         Significant Hospital Events   1/21 admit 1/24 extubated 1/25 transferred out of ICU 1/26 discharged  Significant Diagnostic Studies  CT head 1/21 > negative. MRI brain 1/21 > negative.  Micro Data  Flu B 1/20 > positive. Flu A/RSV/COVID 1/20 > negative. Blood 1/20 > negative. CSF 1/21 > negative.  Antimicrobials  Ampicillin 1/20 > 1/21. Acyclovir 1/21 Vanc 1/21 > 1/22. Ceftriaxone 1/21 > 1/25. Tamiflu 1/21 > 1/25.  Consults  Neurology.  Objective:  Blood pressure 128/72, pulse 74, temperature 98.5 F (36.9 C), temperature source Oral, resp. rate 18, height 6\' 3"  (1.905 m), weight 90.7 kg, SpO2 99 %.        Intake/Output Summary (Last 24 hours) at 10/25/2022 [phone]  Last data filed at 10/25/2022 0345 Gross per 24 hour  Intake 844.29 ml  Output 200 ml  Net 644.29 ml   Filed Weights   10/22/22 0400 10/23/22 0400 10/24/22 0500  Weight: 89.9 kg 90.7 kg 90.7 kg    Physical Examination: General: Adult male, resting in bed, in NAD. Neuro: A&O x 3, no deficits. HEENT: [place]/AT. Sclerae anicteric. EOMI. Cardiovascular: RRR, no M/R/G.  Lungs: Respirations even and unlabored.  CTA bilaterally, No W/R/R. Abdomen: BS x 4, soft, NT/ND.  Musculoskeletal:  No gross deformities, no edema.  Skin: Intact, warm, no rashes.   Discharge Labs:  BMET Recent Labs  Lab 10/20/22 0120 10/21/22 0454 10/21/22 1412 10/22/22 0511 10/22/22 1638 10/23/22 0416 10/23/22 1627 10/24/22 0442 10/25/22 0624  NA 135 136  --  139  --  141  --   --  137  K 3.5 3.4*  --  3.2*  --  3.7  --   --  3.8  CL 102 104  --  107  --  110  --   --  106  CO2 21* 24  --  22  --  24  --   --  21*  GLUCOSE 91 108*  --  88  --  126*  --   --  108*  BUN 10 10  --  9  --  7  --   --  11  CREATININE 1.02 0.95  --  0.71  --  0.78  --   --  0.66  CALCIUM 8.2* 7.7*  --  7.9*  --  7.8*  --   --  8.5*  MG 2.0 2.1   < > 1.9 1.8 1.8 1.6* 1.7 2.1  PHOS 4.4  --    < > 1.7* 1.7* 3.3 2.8 2.9 2.3*   < > = values in this interval not displayed.    CBC Recent Labs  Lab 10/20/22 2049 10/21/22 0625 10/25/22 0624  HGB 14.0 12.7* 11.5*  HCT 41.1 38.3* 33.2*  WBC 22.9* 12.4* 7.6  PLT 169 157 273    Anti-Coagulation No results for input(s): "INR" in the last 168 hours.  Discharge Instructions     Diet - low sodium heart healthy   Complete by: As directed    Increase activity slowly   Complete by: As directed        Allergies as of 10/25/2022   No Known Allergies      Medication List     TAKE these medications    levETIRAcetam 500 MG tablet Commonly known as: KEPPRA Take 1 tablet (500 mg total) by mouth 2 (two) times daily.               Durable Medical Equipment  (From admission, onward)           Start     Ordered   10/24/22 1451  For home use only DME oxygen  Once       Question Answer Comment  Length of Need 6 Months   Oxygen delivery system Gas      10/24/22 1450             Disposition: Home   Discharge Condition:  Timothy Lynch has met maximum benefit of inpatient care and is medically stable and cleared for discharge.  Patient is pending follow up as above.     Time spent on discharge: 30 minutes.   Rutherford Guys, PA -  [facility] [place] [facility]  [facility] For pager details, please see AMION or use Epic chat  After 1900, please call [facility] for cross coverage needs 10/25/2022, 9:38 AM

## 2022-10-24 NOTE — Progress Notes (Addendum)
NAME:  Timothy Lynch, MRN:  [id number], DOB:  January 06, 1996, LOS: 4 ADMISSION DATE:  10/19/2022 CONSULTATION DATE:  10/20/2022 REFERRING MD:  Darl Householder - EDP CHIEF COMPLAINT:  Seizure   History of Present Illness:  27 year old man who presented to [facility] ED via EMS after seizure x 2. No significant PMHx; remote history of seizure x 1 in 2014 (per girlfriend at bedside), remote history of asthma, marijuana use.  History is obtained from chart and patient's girlfriend (at bedside). States patient first had a seizure morning of admission (1/20) around 0730 without any prodromal symptoms. No recent c/o fever/chills/CP/SOB, no dizziness or HA. Patient drinks socially but no heavy drinking or daily alcohol use. Patient does use marijuana. Patient subsequently had 2 seizures throughout the course of the day, each lasting several minutes. EMS was called after patient appeared postictal and confused. On ED arrival, agitated and combative and not redirectable. Versed 10mg  IM administered by EMS and restraints utilized. Attempted CT Head with Ativan and Haldol but patient did not tolerate this due to ongoing agitation and aggression toward staff. Geodon, Ativan and Benadryl administered by EDP. Ketamine initiated. Due to need for increased sedation and airway protection, patient was intubated in ED.   In ED, mildly febrile to 100.37F, tachycardic to 100s, normotensive, SpO2 100%. Labs demonstrated CBC WNL, Na 134, K 3.2, CO2 22, Cr 0.99, Ca 8.7. AST/ALT/AP WNL, Tbili mildly elevated to 1.4. LA 0.9. APAP/salicylates negative and UDS positive for benzos/THC. Ethanol negative. UA with small Hgb, ketones, protein. Flu B positive, COVID/RSV negative. BCx pending. CXR with mild to moderate L basilar atelectasis vs infiltrate; CT Head NAICA, chronic nasal bone fx. Neuro consulted for seizures.  PCCM consulted for ICU admission.  Pertinent Medical History:  Remote history of seizure x 1 (2014), remote history of asthma, marijuana  use  Significant Hospital Events: Including procedures, antibiotic start and stop dates in addition to other pertinent events   1/20 - BIB EMS for seizures x 2 at home. LG fever to 100.37F, no other symptoms. Flu B+. Agitated/aggressive/not redirectable in ED requiring increased sedation; intubated for airway protection.  CT Head NAICA. Neuro consulted. PCCM consulted for ICU admission. 1/22 Remains intermittently agitated, sedation increased, no seizures 1/24 Extubated successfuly but was quite agitated so started on Precedex  Interim History / Subjective:  Has been on Precedex since yesterday but down to 1.1 Turned off during rounds this AM He is very eager to go home Temp to 101.7 overnight, 100.1 currently. Getting Tamiflu and CTX. Did have bloody sputum and epistaxis post extubation yesterday.  Objective:  Blood pressure (!) 145/85, pulse (!) 163, temperature 98.5 F (36.9 C), temperature source Axillary, resp. rate (!) 51, height 6\' 3"  (1.905 m), weight 90.7 kg, SpO2 99 %.    Vent Mode: PSV;CPAP FiO2 (%):  [40 %] 40 % Set Rate:  [18 bmp] 18 bmp Vt Set:  [670 mL] 670 mL PEEP:  [5 cmH20] 5 cmH20 Pressure Support:  [5 cmH20] 5 cmH20 Plateau Pressure:  [20 cmH20] 20 cmH20   Intake/Output Summary (Last 24 hours) at 10/24/2022 0739 Last data filed at 10/24/2022 0700 Gross per 24 hour  Intake 1207.31 ml  Output 650 ml  Net 557.31 ml    Filed Weights   10/22/22 0400 10/23/22 0400 10/24/22 0500  Weight: 89.9 kg 90.7 kg 90.7 kg   Physical Examination: General: Young adult male, resting in bed, in NAD. Neuro: A&O x 3, no deficits. HEENT: [place]/AT. Sclerae anicteric. Cardiovascular: RRR, no  M/R/G.  Lungs: Respirations even and unlabored.  CTA bilaterally, No W/R/R. Abdomen: BS x 4, soft, NT/ND.  Musculoskeletal: No gross deformities, no edema.  Skin: Intact, warm, no rashes.   Assessment & Plan:   Seizure of unknown etiology - MRI negative. Does have hx of THC use (UDS also  positive). No neuro symptoms recently besides fevers at home. LP 1/21 was negative. Metabolic Encephalopathy - Neuro following, appreciate recommendations - AEDs per Neuro (Keppra 500mg ) - Seizure precautions (Outpatient seizure precautions: Per [facility] statutes, patients with seizures are not allowed to drive until  they have been seizure-free for six months. Use caution when using heavy equipment or power tools. Avoid working on ladders or at heights. Take showers instead of baths. Ensure the water temperature is not too high on the home water heater. Do not go swimming alone. When caring for infants or small children, sit down when holding, feeding, or changing them to minimize risk of injury to the child in the event you have a seizure. Also, Maintain good sleep hygiene. Avoid alcohol). - Avoid sedating meds - Will need outpatient follow up  Acute hypoxemic respiratory failure in the setting of influenza B, Possible aspiration, and sedation for seizure activity - s/p extubation 1/24. Remote history of asthma - Continue supplemental O2 as needed to maintain SpO2 > 92%.  - Ambulatory desaturation study. - Bronchial hygiene - Supportive care, continue Tamiflu - Continue CTX through 1/25 for 5 days total abx - Will add on CXR today given fever overnight  Nutrition - Push diet - Ensure  Agitation - D/c Zyprexa, doubt will need (he is more anxious about discharge then anything) - Low dose Xanax PRN  Hold Precedex. Hope to transfer out of ICU today 1/25 and likely d/c home 1/26   Best Practice: (right click and "Reselect all SmartList Selections" daily)   Diet/type: Regular consistency (see orders) DVT prophylaxis: SCDs, SQH GI prophylaxis: PPI Lines: N/A Foley:  N/A Code Status:  full code Last date of multidisciplinary goals of care discussion [Pending]   CC time: 30 min.   Montey Hora, [facility] For pager details, please  see AMION or use Epic chat  After 1900, please call [facility] for cross coverage needs 10/24/2022, 7:39 AM

## 2022-10-25 ENCOUNTER — Other Ambulatory Visit (HOSPITAL_COMMUNITY): Payer: Self-pay

## 2022-10-25 LAB — CBC
HCT: 33.2 % — ABNORMAL LOW (ref 39.0–52.0)
Hemoglobin: 11.5 g/dL — ABNORMAL LOW (ref 13.0–17.0)
MCH: 31.3 pg (ref 26.0–34.0)
MCHC: 34.6 g/dL (ref 30.0–36.0)
MCV: 90.2 fL (ref 80.0–100.0)
Platelets: 273 10*3/uL (ref 150–400)
RBC: 3.68 MIL/uL — ABNORMAL LOW (ref 4.22–5.81)
RDW: 12.2 % (ref 11.5–15.5)
WBC: 7.6 10*3/uL (ref 4.0–10.5)
nRBC: 0 % (ref 0.0–0.2)

## 2022-10-25 LAB — GLUCOSE, CAPILLARY
Glucose-Capillary: 127 mg/dL — ABNORMAL HIGH (ref 70–99)
Glucose-Capillary: 114 mg/dL — ABNORMAL HIGH (ref 70–99)

## 2022-10-25 LAB — BASIC METABOLIC PANEL
Anion gap: 10 (ref 5–15)
BUN: 11 mg/dL (ref 6–20)
CO2: 21 mmol/L — ABNORMAL LOW (ref 22–32)
Calcium: 8.5 mg/dL — ABNORMAL LOW (ref 8.9–10.3)
Chloride: 106 mmol/L (ref 98–111)
Creatinine, Ser: 0.66 mg/dL (ref 0.61–1.24)
GFR, Estimated: >60 mL/min (ref >60)
Glucose, Bld: 108 mg/dL — ABNORMAL HIGH (ref 70–99)
Potassium: 3.8 mmol/L (ref 3.5–5.1)
Sodium: 137 mmol/L (ref 135–145)

## 2022-10-25 LAB — MAGNESIUM: Magnesium: 2.1 mg/dL (ref 1.7–2.4)

## 2022-10-25 LAB — CULTURE, BLOOD (ROUTINE X 2)
Culture: NO GROWTH
Culture: NO GROWTH
Special Requests: ADEQUATE
Special Requests: ADEQUATE

## 2022-10-25 LAB — PHOSPHORUS: Phosphorus: 2.3 mg/dL — ABNORMAL LOW (ref 2.5–4.6)

## 2022-10-25 MED ORDER — LEVETIRACETAM 500 MG PO TABS
500.0000 mg | ORAL_TABLET | Freq: Two times a day (BID) | ORAL | 1 refills | Status: DC
  Filled 2022-10-25: qty 120, 60d supply, fill #0

## 2022-10-25 MED ORDER — LEVETIRACETAM 500 MG PO TABS: 500.0000 mg | ORAL_TABLET | Freq: Two times a day (BID) | ORAL | 1 refills | Status: DC

## 2022-10-25 NOTE — Progress Notes (Signed)
Mobility Specialist: Progress Note   10/25/22 0958  Mobility  Activity Ambulated independently in hallway  Level of Assistance Independent  Assistive Device None  Distance Ambulated (ft) 500 ft  Activity Response Tolerated well  Mobility Referral Yes  $Mobility charge 1 Mobility   Received pt in bed having no complaints and agreeable to mobility. Pt was asymptomatic throughout ambulation and returned to room w/o fault. Left in bed w/ call bell in reach and all needs met.  Grenelefe Timothy Lynch Mobility Specialist Please contact via SecureChat or Rehab office at [phone]

## 2022-10-25 NOTE — Progress Notes (Signed)
Nurse requested Mobility Specialist to perform oxygen saturation test with pt which includes removing pt from oxygen both at rest and while ambulating.  Below are the results from that testing.     Patient Saturations on Room Air at Rest = spO2 96%  Patient Saturations on Room Air while Ambulating = sp02 96%   Patient Saturations on N/A Liters of oxygen while Ambulating = N/A  At end of testing pt left in room on N/A  Liters of oxygen.  Reported results to nurse.

## 2022-10-25 NOTE — TOC Transition Note (Signed)
Transition of Care [facility]) - CM/SW Discharge Note   Patient Details  Name: Timothy Lynch MRN: [id number] Date of Birth: 08-21-1996  [facility] CM/SW Contact:  Pollie Friar, RN Phone Number: 10/25/2022, 11:28 AM   Clinical Narrative:    Pt is discharging home with self care. Pt with orders for home oxygen but ambulatory sats today did not qualify him for home oxygen as he remained at 96%. MD aware.  CM has arranged him a PCP appt with [facility]. Information on the AVS.  [facility] also added to AVS for medication assistance after d/c.  TOC pharmacy to deliver his d/c med to the room.  Pt has transportation home.    Final next level of care: Home/Self Care Barriers to Discharge: Inadequate or no insurance, Barriers Unresolved (comment)   Patient Goals and CMS Choice      Discharge Placement                         Discharge Plan and Services Additional resources added to the After Visit Summary for                                       Social Determinants of Health (SDOH) Interventions SDOH Screenings   Food Insecurity: No Food Insecurity (10/24/2022)  Housing: Low Risk  (10/24/2022)  Transportation Needs: No Transportation Needs (10/24/2022)  Utilities: Not At Risk (10/24/2022)  Tobacco Use: High Risk (10/19/2022)     Readmission Risk Interventions     No data to display

## 2022-10-28 LAB — GLUCOSE, CAPILLARY: Glucose-Capillary: 121 mg/dL — ABNORMAL HIGH (ref 70–99)

## 2022-10-30 ENCOUNTER — Inpatient Hospital Stay: Payer: Self-pay | Admitting: Nurse Practitioner

## 2022-11-07 ENCOUNTER — Encounter: Payer: Self-pay | Admitting: Nurse Practitioner

## 2022-11-07 ENCOUNTER — Ambulatory Visit (INDEPENDENT_AMBULATORY_CARE_PROVIDER_SITE_OTHER): Payer: Self-pay | Admitting: Nurse Practitioner

## 2022-11-07 VITALS — BP 110/61 | HR 99 | Temp 97.8°F | Ht 76.0 in | Wt 197.2 lb

## 2022-11-07 DIAGNOSIS — R569 Unspecified convulsions: Secondary | ICD-10-CM

## 2022-11-07 DIAGNOSIS — R739 Hyperglycemia, unspecified: Secondary | ICD-10-CM

## 2022-11-07 LAB — POCT GLYCOSYLATED HEMOGLOBIN (HGB A1C): Hemoglobin A1C: 4.6 % (ref 4.0–5.6)

## 2022-11-07 NOTE — Addendum Note (Signed)
Addended by: Fenton Foy on: 11/07/2022 02:59 PM   Modules accepted: Level of Service

## 2022-11-07 NOTE — Progress Notes (Signed)
@Patient  ID: , male    DOB: 11-06-1995, 27 y.o.   MRN: [id number]  Chief Complaint  Patient presents with   Follow-up    Referring provider: No ref. provider found   Recent significant events:  Hospital admission: 10/19/22   Hospital Course: SIYON LINCK is a 27 y.o. y/o male with a PMH of asthma, THC use and remote seizure in 2014. He presented to [facility] ED 1/20 after a seizure at home. He had apparently had a seizure earlier that day that resolved spontaneously then had 2 more later in the day, each one lasting several minutes. EMS was called after he remained confused and post-ictal.   On ED arrival, he was extremely agitated and combative and ultimately required intubation. His workup including CT head, MRI brain, and LP was essentially negative; however, flu B was positive. UDS positive for THC. LTM showed moderate diffuse encephalopathy without epileptiform activity.    He was liberated from the ventilator on 1/24; however, he became extremely agitated thereafter and required Precedex infusion and Haldol PRN. His Precedex was weaned off AM 1/25. He had fever to 101.7 overnight but resolved later that day. He continued to require 2 - 4L O2 via [place] and when this was removed, he desaturated to the 80s. Later that evening, saturations improved and he was weaned off O2 completely. He did have noteable bloody sputum and epistaxis following extubation and this had resolved completely by 1/25.   He was treated with 5 days Ceftriaxone for possible CAP/aspiration as well as 5 days of Tamiflu.   Later afternoon 1/25, he was transferred out of the ICU to the floor while home O2 etc was being arranged.   On 1/26, he was deemed medically stable and was cleared for discharge home.   HPI  Patient presents today for hospital follow-up (see notes above).  Patient has been doing well since hospital discharge.  He denies any seizures since hospital discharge.  He does continue to take  Keppra twice daily.  We will get him an appointment scheduled to follow-up with neurology.  Blood sugars were noted to be elevated in the hospital.  We did check an A1c today which was in normal range.  Denies f/c/s, n/v/d, hemoptysis, PND, leg swelling Denies chest pain or edema   No Known Allergies   There is no immunization history on file for this patient.  History reviewed. No pertinent past medical history.  Tobacco History: Social History   Tobacco Use  Smoking Status Every Day   Packs/day: 0.50   Types: Cigarettes  Smokeless Tobacco Never   Ready to quit: Not Answered Counseling given: Not Answered   Outpatient Encounter Medications as of 11/07/2022  Medication Sig   levETIRAcetam (KEPPRA) 500 MG tablet Take 1 tablet (500 mg total) by mouth 2 (two) times daily.   No facility-administered encounter medications on file as of 11/07/2022.     Review of Systems  Review of Systems  Constitutional: Negative.   HENT: Negative.    Cardiovascular: Negative.   Gastrointestinal: Negative.   Allergic/Immunologic: Negative.   Neurological: Negative.   Psychiatric/Behavioral: Negative.         Physical Exam  BP 110/61   Pulse 99   Temp 97.8 F (36.6 C)   Ht 6\' 4"  (1.93 m)   Wt 197 lb 3.2 oz (89.4 kg)   SpO2 100%   BMI 24.00 kg/m   Wt Readings from Last 5 Encounters:  11/07/22 197 lb 3.2 oz (  89.4 kg)  10/24/22 199 lb 15.3 oz (90.7 kg)  03/22/19 200 lb 9.9 oz (91 kg)  07/09/15 200 lb (90.7 kg)  01/04/13 230 lb (104.3 kg) (98 %, Z= 2.10)*   * Growth percentiles are based on CDC (Boys, 2-20 Years) data.     Physical Exam Vitals and nursing note reviewed.  Constitutional:      General: He is not in acute distress.    Appearance: He is well-developed.  Cardiovascular:     Rate and Rhythm: Normal rate and regular rhythm.  Pulmonary:     Effort: Pulmonary effort is normal.     Breath sounds: Normal breath sounds.  Skin:    General: Skin is warm and dry.   Neurological:     Mental Status: He is alert and oriented to person, place, and time.      Lab Results:  CBC    Component Value Date/Time   WBC 7.6 10/25/2022 0624   RBC 3.68 (L) 10/25/2022 0624   HGB 11.5 (L) 10/25/2022 0624   HCT 33.2 (L) 10/25/2022 0624   PLT 273 10/25/2022 0624   MCV 90.2 10/25/2022 0624   MCH 31.3 10/25/2022 0624   MCHC 34.6 10/25/2022 0624   RDW 12.2 10/25/2022 0624   LYMPHSABS 1.1 10/20/2022 2049   MONOABS 1.6 (H) 10/20/2022 2049   EOSABS 0.0 10/20/2022 2049   BASOSABS 0.0 10/20/2022 2049    BMET    Component Value Date/Time   NA 137 10/25/2022 0624   K 3.8 10/25/2022 0624   CL 106 10/25/2022 0624   CO2 21 (L) 10/25/2022 0624   GLUCOSE 108 (H) 10/25/2022 0624   BUN 11 10/25/2022 0624   CREATININE 0.66 10/25/2022 0624   CALCIUM 8.5 (L) 10/25/2022 0624   GFRNONAA >60 10/25/2022 0624   GFRAA >60 03/22/2019 0438    BNP No results found for: "BNP"  ProBNP No results found for: "PROBNP"  Imaging: DG CHEST PORT 1 VIEW  Result Date: 10/24/2022 CLINICAL DATA:  Fever. EXAM: PORTABLE CHEST 1 VIEW COMPARISON:  10/21/2022. FINDINGS: 0958 hours. Interval removal of the endotracheal and enteric tubes. New patchy airspace opacities throughout both lungs, likely atypical or viral infection. Stable cardiac and mediastinal contours. No pleural effusion or pneumothorax. IMPRESSION: New patchy airspace opacities throughout both lungs, likely atypical or viral infection. Electronically Signed   By: Emmit Alexanders M.D.   On: 10/24/2022 10:06   ECHOCARDIOGRAM COMPLETE  Result Date: 10/21/2022    ECHOCARDIOGRAM REPORT   Patient Name:   ROAN MIKLOS Date of Exam: 10/21/2022 Medical Rec #:  [id number]     Height:       75.0 in Accession #:    [id number]    Weight:       202.2 lb Date of Birth:  [birth year]     BSA:          2.204 m Patient Age:    26 years      BP:           102/55 mmHg Patient Gender: M             HR:           80 bpm. Exam Location:  Inpatient  Procedure: 2D Echo, Cardiac Doppler and Color Doppler Indications:    Cardiomegaly I51.7  History:        Patient has no prior history of Echocardiogram examinations.  Sonographer:    Ronny Flurry Referring Phys: [place]  1.  Left ventricular ejection fraction, by estimation, is 65 to 70%. The left ventricle has normal function. The left ventricle has no regional wall motion abnormalities. Left ventricular diastolic parameters were normal.  2. Right ventricular systolic function is normal. The right ventricular size is normal.  3. The mitral valve is myxomatous. Trivial mitral valve regurgitation. No evidence of mitral stenosis.  4. The aortic valve is tricuspid. Aortic valve regurgitation is not visualized. No aortic stenosis is present.  5. Aortic dilatation noted. 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  6. The inferior vena cava is dilated in size with >50% respiratory variability, suggesting right atrial pressure of 8 mmHg. FINDINGS  Left Ventricle: Left ventricular ejection fraction, by estimation, is 65 to 70%. The left ventricle has normal function. The left ventricle has no regional wall motion abnormalities. The left ventricular internal cavity size was normal in size. There is  no left ventricular hypertrophy. Left ventricular diastolic parameters were normal. Normal left ventricular filling pressure. Right Ventricle: The right ventricular size is normal. No increase in right ventricular wall thickness. Right ventricular systolic function is normal. Left Atrium: Left atrial size was normal in size. Right Atrium: Right atrial size was normal in size. Pericardium: There is no evidence of pericardial effusion. Mitral Valve: The mitral valve is myxomatous. Trivial mitral valve regurgitation. No evidence of mitral valve stenosis. Tricuspid Valve: The tricuspid valve is normal in structure. Tricuspid valve regurgitation is not demonstrated. No evidence of  tricuspid stenosis. Aortic Valve: The aortic valve is tricuspid. Aortic valve regurgitation is not visualized. No aortic stenosis is present. Aortic valve mean gradient measures 3.0 mmHg. Aortic valve peak gradient measures 5.8 mmHg. Aortic valve area, by VTI measures 3.81 cm. Pulmonic Valve: The pulmonic valve was normal in structure. Pulmonic valve regurgitation is trivial. No evidence of pulmonic stenosis. Aorta: Aortic dilatation noted. 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 Venous: The inferior vena cava is dilated in size with greater than 50% respiratory variability, suggesting right atrial pressure of 8 mmHg. IAS/Shunts: No atrial level shunt detected by color flow Doppler.  LEFT VENTRICLE PLAX 2D LVIDd:         5.90 cm   Diastology LVIDs:         3.70 cm   LV e' medial:    11.40 cm/s LV PW:         1.00 cm   LV E/e' medial:  4.9 LV IVS:        0.90 cm   LV e' lateral:   18.50 cm/s LVOT diam:     2.50 cm   LV E/e' lateral: 3.0 LV SV:         85 LV SV Index:   38 LVOT Area:     4.91 cm  RIGHT VENTRICLE             IVC RV S prime:     12.10 cm/s  IVC diam: 2.50 cm TAPSE (M-mode): 1.8 cm LEFT ATRIUM           Index        RIGHT ATRIUM           Index LA diam:      3.80 cm 1.72 cm/m   RA Area:     14.60 cm LA Vol (A2C): 32.5 ml 14.74 ml/m  RA Volume:   36.70 ml  16.65 ml/m LA Vol (A4C): 45.3 ml 20.55 ml/m  AORTIC VALVE AV Area (Vmax):  3.76 cm AV Area (Vmean):   3.78 cm AV Area (VTI):     3.81 cm AV Vmax:           120.00 cm/s AV Vmean:          81.400 cm/s AV VTI:            0.222 m AV Peak Grad:      5.8 mmHg AV Mean Grad:      3.0 mmHg LVOT Vmax:         91.80 cm/s LVOT Vmean:        62.700 cm/s LVOT VTI:          0.173 m LVOT/AV VTI ratio: 0.78  AORTA Ao Root diam: 4.40 cm MV E velocity: 55.40 cm/s                            SHUNTS                            Systemic VTI:  0.17 m                            Systemic Diam: 2.50 cm Skeet Latch MD Electronically signed  by Skeet Latch MD Signature Date/Time: 10/21/2022/10:50:05 PM    Final    DG Chest Port 1 View  Result Date: 10/21/2022 CLINICAL DATA:  Influenza with endotracheal tube. EXAM: PORTABLE CHEST 1 VIEW COMPARISON:  10/19/2022 FINDINGS: 0517 hours. Unchanged endotracheal tube with tip projecting 3.0 cm above the carina. Enteric tube tip and side port project over the stomach. Improved aeration of the left upper lobe with decreased leftward mediastinal shift. Persistent retrocardiac opacity and volume loss. Small left pleural effusion. No pneumothorax. Right lung is well aerated. IMPRESSION: 1. Stable support tubes. 2. Improved aeration of the left upper lobe with persistent retrocardiac opacity and small left pleural effusion. Electronically Signed   By: Emmit Alexanders M.D.   On: 10/21/2022 07:53   MR BRAIN WO CONTRAST  Result Date: 10/20/2022 CLINICAL DATA:  Seizure, new-onset, no history of trauma EXAM: MRI HEAD WITHOUT CONTRAST TECHNIQUE: Multiplanar, multiecho pulse sequences of the brain and surrounding structures were obtained without intravenous contrast. COMPARISON:  CT head 10/19/2022. FINDINGS: Brain: No acute infarction, hemorrhage, hydrocephalus, extra-axial collection or mass lesion. Unremarkable hippocampi. Vascular: Major arterial flow voids are maintained at the skull base. Skull and upper cervical spine: Normal marrow signal. Sinuses/Orbits: Mild paranasal sinus mucosal thickening. No acute orbital findings. Other: No mastoid effusions. IMPRESSION: Normal brain MRI.  No evidence of acute intracranial abnormality. Electronically Signed   By: Margaretha Sheffield M.D.   On: 10/20/2022 15:36   Overnight EEG with video  Result Date: 10/20/2022 Lora Havens, MD     10/21/2022  9:18 AM Patient Name: DAQUAN CRAPPS MRN: [id number] Epilepsy Attending: Lora Havens Referring Physician/Provider: Donnetta Simpers, MD Duration: 10/20/2022 0148 to 10/21/2022 0148 Patient history:  27 y.o. male  with PMH significant for marijuana use who presents with 2 seizure like episodes and intubated. EEG to evaluate for seizure Level of alertness:  comatose AEDs during EEG study: LEV, propofol Technical aspects: This EEG study was done with scalp electrodes positioned according to the 10-20 International system of electrode placement. Electrical activity was reviewed with band pass filter of 1-70Hz , sensitivity of 7 uV/mm, display speed of 39mm/sec with a 60Hz  notched filter  applied as appropriate. EEG data were recorded continuously and digitally stored.  Video monitoring was available and reviewed as appropriate. Description: EEG showed continuous generalized 3 to 6 Hz theta-delta slowing with overriding 15 to 18 Hz beta activity distributed symmetrically and diffusely. Hyperventilation and photic stimulation were not performed.   ABNORMALITY - Continuous slow, generalized IMPRESSION: This study is suggestive of severe diffuse encephalopathy, nonspecific etiology but likely related to sedation. No seizures or epileptiform discharges were seen throughout the recording. Priyanka Annabelle Harman   CT HEAD WO CONTRAST ( )  Result Date: 10/19/2022 CLINICAL DATA:  Seizure, new onset, no history of trauma. EXAM: CT HEAD WITHOUT CONTRAST TECHNIQUE: Contiguous axial images were obtained from the base of the skull through the vertex without intravenous contrast. RADIATION DOSE REDUCTION: This exam was performed according to the departmental dose-optimization program which includes automated exposure control, adjustment of the mA and/or kV according to patient size and/or use of iterative reconstruction technique. COMPARISON:  None Available. FINDINGS: Brain: No acute intracranial hemorrhage, midline shift or mass effect. No extra-axial fluid collection. Gray-white matter differentiation is within normal limits. No hydrocephalus. Vascular: No hyperdense vessel or unexpected calcification. Skull: Normal. Negative for fracture or  focal lesion. Sinuses/Orbits: Mild mucosal thickening in the ethmoid air cells bilaterally and maxillary sinuses. Other: Soft tissue swelling is present at the nose. There is deformity of the nasal bones bilaterally. IMPRESSION: 1. No acute intracranial process. 2. Bilateral nasal bone deformity. Correlate clinically for acute or chronic fractures. Electronically Signed   By: Thornell Sartorius M.D.   On: 10/19/2022 22:31   DG Abd Portable 1 View  Result Date: 10/19/2022 CLINICAL DATA:  Orogastric tube placement. EXAM: PORTABLE ABDOMEN - 1 VIEW COMPARISON:  None Available. FINDINGS: An orogastric tube is seen with its distal end looped within the region of the gastric fundus. A dilated small bowel loop is seen within the mid left abdomen. No radio-opaque calculi or other significant radiographic abnormality are seen. IMPRESSION: 1. Orogastric tube positioning, as described above. 2. Dilated small bowel loops within the mid left abdomen. While this may be transient in nature, further evaluation with abdomen pelvis CT is recommended if a partial small bowel obstruction is of clinical concern. Electronically Signed   By: Aram Candela M.D.   On: 10/19/2022 22:20   DG Chest Port 1 View  Result Date: 10/19/2022 CLINICAL DATA:  Endotracheal tube and orogastric tube placement. EXAM: PORTABLE CHEST 1 VIEW COMPARISON:  January 04, 2013 FINDINGS: An endotracheal tube is seen with its distal tip approximately 5.3 cm from the carina. An enteric tube is noted with its distal end looped within the expected region of the gastric antrum. The cardiac silhouette is within limits. Low lung volumes are seen on the left with mild to moderate severity left basilar atelectasis and/or infiltrate. Mild right to left shift of the mediastinal structures is noted. There is no evidence of a pleural effusion or pneumothorax. The visualized skeletal structures are unremarkable. IMPRESSION: 1. Endotracheal tube and enteric tube positioning, as  described above. 2. Mild to moderate severity left basilar atelectasis and/or infiltrate with left-sided volume loss. Electronically Signed   By: Aram Candela M.D.   On: 10/19/2022 22:19     Assessment & Plan:   Seizure [facility] - Ambulatory referral to Neurology  2. Blood glucose elevated  - POCT glycosylated hemoglobin (Hb A1C)    -Continue Keppra 500mg  BID indefinitely.  - Please call [facility] [phone]) for a follow up appointment in the next  2 weeks.  - Seizure precautions: Per [facility] statutes, patients with seizures are not allowed to drive until they have been seizure-free for six months. Use caution when using heavy equipment or power tools. Avoid working on ladders or at heights. Take showers instead of baths. Ensure the water temperature is not too high on the home water heater. Do not go swimming alone. When caring for infants or small children, sit down when holding, feeding, or changing them to minimize risk of injury to the child in the event you have a seizure. Also, Maintain good sleep hygiene. Avoid alcohol.  - Avoid THC/substance use/EtOH.   Follow up:  Follow up in 3 months     Fenton Foy, NP 11/07/2022

## 2022-11-07 NOTE — Assessment & Plan Note (Signed)
-   Ambulatory referral to Neurology  2. Blood glucose elevated  - POCT glycosylated hemoglobin (Hb A1C)    -Continue Keppra 500mg  BID indefinitely.  - Please call [facility] [phone]) for a follow up appointment in the next 2 weeks.  - Seizure precautions: Per [facility] statutes, patients with seizures are not allowed to drive until they have been seizure-free for six months. Use caution when using heavy equipment or power tools. Avoid working on ladders or at heights. Take showers instead of baths. Ensure the water temperature is not too high on the home water heater. Do not go swimming alone. When caring for infants or small children, sit down when holding, feeding, or changing them to minimize risk of injury to the child in the event you have a seizure. Also, Maintain good sleep hygiene. Avoid alcohol.  - Avoid THC/substance use/EtOH.   Follow up:  Follow up in 3 months

## 2022-11-07 NOTE — Patient Instructions (Addendum)
1. Seizure ([place])  - Ambulatory referral to Neurology  2. Blood glucose elevated  - POCT glycosylated hemoglobin (Hb A1C)    -Continue Keppra 500mg  BID indefinitely.  - Please call [facility] [phone]) for a follow up appointment in the next 2 weeks.  - Seizure precautions: Per [facility] statutes, patients with seizures are not allowed to drive until they have been seizure-free for six months. Use caution when using heavy equipment or power tools. Avoid working on ladders or at heights. Take showers instead of baths. Ensure the water temperature is not too high on the home water heater. Do not go swimming alone. When caring for infants or small children, sit down when holding, feeding, or changing them to minimize risk of injury to the child in the event you have a seizure. Also, Maintain good sleep hygiene. Avoid alcohol.  - Avoid THC/substance use/EtOH.   Follow up:  Follow up in 3 months

## 2022-11-11 ENCOUNTER — Inpatient Hospital Stay (INDEPENDENT_AMBULATORY_CARE_PROVIDER_SITE_OTHER): Payer: Self-pay | Admitting: Primary Care

## 2022-11-14 ENCOUNTER — Ambulatory Visit: Payer: Self-pay | Admitting: Neurology

## 2022-12-23 ENCOUNTER — Ambulatory Visit: Payer: Self-pay | Admitting: Neurology

## 2023-02-05 ENCOUNTER — Ambulatory Visit: Payer: Self-pay | Admitting: Nurse Practitioner

## 2023-02-06 ENCOUNTER — Ambulatory Visit: Payer: Self-pay | Admitting: Neurology

## 2023-04-15 ENCOUNTER — Ambulatory Visit: Payer: Self-pay | Admitting: Neurology

## 2023-06-10 ENCOUNTER — Encounter (HOSPITAL_COMMUNITY): Payer: Self-pay

## 2023-06-10 ENCOUNTER — Other Ambulatory Visit (HOSPITAL_COMMUNITY): Payer: Self-pay

## 2023-06-10 ENCOUNTER — Emergency Department (HOSPITAL_COMMUNITY): Payer: Self-pay

## 2023-06-10 ENCOUNTER — Emergency Department (HOSPITAL_COMMUNITY)
Admission: EM | Admit: 2023-06-10 | Discharge: 2023-06-10 | Disposition: A | Discharge status: Home / Self Care | Payer: Self-pay | Attending: Emergency Medicine | Admitting: Emergency Medicine

## 2023-06-10 DIAGNOSIS — G40909 Epilepsy, unspecified, not intractable, without status epilepticus: Secondary | ICD-10-CM | POA: Insufficient documentation

## 2023-06-10 LAB — COMPREHENSIVE METABOLIC PANEL
ALT: 16 U/L (ref 0–44)
AST: 23 U/L (ref 15–41)
Albumin: 3.8 g/dL (ref 3.5–5.0)
Alkaline Phosphatase: 57 U/L (ref 38–126)
Anion gap: 15 (ref 5–15)
BUN: 9 mg/dL (ref 6–20)
CO2: 23 mmol/L (ref 22–32)
Calcium: 9.2 mg/dL (ref 8.9–10.3)
Chloride: 101 mmol/L (ref 98–111)
Creatinine, Ser: 0.96 mg/dL (ref 0.61–1.24)
GFR, Estimated: >60 mL/min (ref >60)
Glucose, Bld: 89 mg/dL (ref 70–99)
Potassium: 4 mmol/L (ref 3.5–5.1)
Sodium: 139 mmol/L (ref 135–145)
Total Bilirubin: 1.6 mg/dL — ABNORMAL HIGH (ref 0.3–1.2)
Total Protein: 6.9 g/dL (ref 6.5–8.1)

## 2023-06-10 LAB — CBC WITH DIFFERENTIAL/PLATELET
Abs Immature Granulocytes: 0.04 10*3/uL (ref 0.00–0.07)
Basophils Absolute: 0 10*3/uL (ref 0.0–0.1)
Basophils Relative: 0 %
Eosinophils Absolute: 0.3 10*3/uL (ref 0.0–0.5)
Eosinophils Relative: 3 %
HCT: 42.3 % (ref 39.0–52.0)
Hemoglobin: 14 g/dL (ref 13.0–17.0)
Immature Granulocytes: 1 %
Lymphocytes Relative: 17 %
Lymphs Abs: 1.3 10*3/uL (ref 0.7–4.0)
MCH: 31.5 pg (ref 26.0–34.0)
MCHC: 33.1 g/dL (ref 30.0–36.0)
MCV: 95.1 fL (ref 80.0–100.0)
Monocytes Absolute: 0.7 10*3/uL (ref 0.1–1.0)
Monocytes Relative: 9 %
Neutro Abs: 5.3 10*3/uL (ref 1.7–7.7)
Neutrophils Relative %: 70 %
Platelets: 255 10*3/uL (ref 150–400)
RBC: 4.45 MIL/uL (ref 4.22–5.81)
RDW: 11.9 % (ref 11.5–15.5)
WBC: 7.6 10*3/uL (ref 4.0–10.5)
nRBC: 0 % (ref 0.0–0.2)

## 2023-06-10 LAB — CBG MONITORING, ED: Glucose-Capillary: 84 mg/dL (ref 70–99)

## 2023-06-10 LAB — MAGNESIUM: Magnesium: 1.9 mg/dL (ref 1.7–2.4)

## 2023-06-10 MED ORDER — LACTATED RINGERS IV BOLUS
1000.0000 mL | Freq: Once | INTRAVENOUS | Status: AC
  Administered 2023-06-10: 1000 mL via INTRAVENOUS

## 2023-06-10 MED ORDER — LEVETIRACETAM IN NACL 1000 MG/100ML IV SOLN
1000.0000 mg | Freq: Once | INTRAVENOUS | Status: AC
  Administered 2023-06-10: 1000 mg via INTRAVENOUS
  Filled 2023-06-10: qty 100

## 2023-06-10 MED ORDER — KETOROLAC TROMETHAMINE 15 MG/ML IJ SOLN
15.0000 mg | Freq: Once | INTRAMUSCULAR | Status: AC
  Administered 2023-06-10: 15 mg via INTRAVENOUS
  Filled 2023-06-10: qty 1

## 2023-06-10 MED ORDER — LORAZEPAM 2 MG/ML IJ SOLN
1.0000 mg | Freq: Once | INTRAMUSCULAR | Status: AC
  Administered 2023-06-10: 1 mg via INTRAVENOUS
  Filled 2023-06-10: qty 1

## 2023-06-10 MED ORDER — LEVETIRACETAM 500 MG PO TABS
500.0000 mg | ORAL_TABLET | Freq: Two times a day (BID) | ORAL | 1 refills | Status: DC
  Filled 2023-06-10: qty 60, 30d supply, fill #0

## 2023-06-10 NOTE — ED Provider Notes (Signed)
[place] EMERGENCY DEPARTMENT AT [facility] Provider Note   CSN: [id number] Arrival date & time: 06/10/23  0555     History  Chief Complaint  Patient presents with   Seizures    Timothy Lynch is a 27 y.o. male.  27 year old male presents today for concern of seizure activity.  This occurred while patient was at work.  He works overnight.  This was witnessed by his coworkers.  He states he recalls the ambulance ride to the emergency department but has no recollection of other events.  He has history of seizures most recently in January 2024 which required admission to the critical care unit due to agitation.  He was intubated during this admission as well.  He states he took Keppra for about 1-1.5 months after this admission and then ran out of the medication.  He was unable to follow-up with neurology and never resumed the medication.  He does not have a PCP.  Reports right knee pain.  Denies other complaints at this time.  The history is provided by the patient. No language interpreter was used.       Home Medications Prior to Admission medications   Medication Sig Start Date End Date Taking? Authorizing Provider  levETIRAcetam (KEPPRA) 500 MG tablet Take 1 tablet (500 mg total) by mouth 2 (two) times daily. 06/10/23  Yes Marita Kansas, PA-C      Allergies    Patient has no known allergies.    Review of Systems   Review of Systems  Constitutional:  Negative for fever.  Respiratory:  Negative for shortness of breath.   Musculoskeletal:  Positive for arthralgias.  Neurological:  Positive for seizures.  All other systems reviewed and are negative.   Physical Exam Updated Vital Signs BP 117/78   Pulse 61   Temp 97.8 F (36.6 C) (Oral)   Resp 16   Ht 6\' 4"  (1.93 m)   Wt 88 kg   SpO2 100%   BMI 23.61 kg/m  Physical Exam Vitals and nursing note reviewed.  Constitutional:      General: He is not in acute distress.    Appearance: Normal appearance. He is not  ill-appearing.  HENT:     Head: Normocephalic and atraumatic.     Nose: Nose normal.     Mouth/Throat:     Comments: Left side of lower lip slightly swollen.  No laceration noted.  No tongue laceration. Eyes:     General: No scleral icterus.    Extraocular Movements: Extraocular movements intact.     Conjunctiva/sclera: Conjunctivae normal.  Cardiovascular:     Rate and Rhythm: Normal rate and regular rhythm.     Pulses: Normal pulses.     Heart sounds: Normal heart sounds.  Pulmonary:     Effort: Pulmonary effort is normal. No respiratory distress.     Breath sounds: Normal breath sounds. No wheezing or rales.  Musculoskeletal:        General: Normal range of motion.     Cervical back: Normal range of motion.  Skin:    General: Skin is warm and dry.  Neurological:     General: No focal deficit present.     Mental Status: He is alert. Mental status is at baseline.     ED Results / Procedures / Treatments   Labs (all labs ordered are listed, but only abnormal results are displayed) Labs Reviewed  COMPREHENSIVE METABOLIC PANEL - Abnormal; Notable for the following components:  Result Value   Total Bilirubin 1.6 (*)    All other components within normal limits  CBC WITH DIFFERENTIAL/PLATELET  MAGNESIUM  LEVETIRACETAM LEVEL  CBG MONITORING, ED    EKG None  Radiology No results found.  Procedures Procedures    Medications Ordered in ED Medications  levETIRAcetam (KEPPRA) IVPB 1000 mg/100 mL premix (0 mg Intravenous Stopped 06/10/23 0811)  lactated ringers bolus 1,000 mL (0 mLs Intravenous Stopped 06/10/23 0925)  LORazepam (ATIVAN) injection 1 mg (1 mg Intravenous Given 06/10/23 0949)  levETIRAcetam (KEPPRA) IVPB 1000 mg/100 mL premix (0 mg Intravenous Stopped 06/10/23 1143)  ketorolac (TORADOL) 15 MG/ML injection 15 mg (15 mg Intravenous Given 06/10/23 1145)    ED Course/ Medical Decision Making/ A&P                                 Medical Decision  Making Amount and/or Complexity of Data Reviewed Labs: ordered. Radiology: ordered.  Risk Prescription drug management.   27 year old male presents for seizure-like activity.  Witnessed by coworkers.  Noncompliant with his Keppra.  Will give Keppra load and obtain labs to ensure electrolytes are within normal.  Hemodynamically stable.  Currently at baseline and alert and oriented.  Will provide fluids and 1 g Keppra load.  CBC without leukocytosis or anemia.  CMP unremarkable with the exception of slight elevation in total bilirubin.  Magnesium 1.9.  Keppra level obtained.  Right knee x-ray without acute bony abnormality.  EKG without acute ischemic change.  Patient around 9:30 AM was found to be somnolent, and agitated.  Appears to be disoriented.  Does not recall our previous conversation.  Has no recollection of being driven to the hospital in an ambulance.  Suspect unwitnessed seizure in the ED.  Discussed with neurology.  They recommend another 1 g load of Keppra and observation in the emergency department for about 2 to 4 hours.  Recommend if patient is adamant and would like to be admitted he can be admitted for observation otherwise he can follow-up outpatient with neurology.  Will consult transitions of care to establish PCP, and help with medication assistance.  Remained without issue throughout observation period in the emergency room.  He has been total of 8-1/2 hours in the emergency department.  Patient feels comfortable going home.  He was sent with a PCP with TOC.  Medication delivered to patient before discharge.  Discharged in stable condition.   Final Clinical Impression(s) / ED Diagnoses Final diagnoses:  Seizure disorder [facility])    Rx / DC Orders ED Discharge Orders          Ordered    levETIRAcetam (KEPPRA) 500 MG tablet  2 times daily        06/10/23 1035              Marita Kansas, PA-C 06/10/23 1430    Linwood Dibbles, MD 06/10/23 1615

## 2023-06-10 NOTE — Discharge Planning (Signed)
RNCM consulted regarding PCP establishment and insurance enrollment. Pt presented to [facility] ED today with seizures.  RNCM obtained appointment on (9/23), time (3:30) with Gwinda Passe, NP and placed on After Visit Summary paperwork.    RNCM consulted regarding medication assistance of pt unable to afford medications.  RNCM utilized Transitions of Care petty cash to cover $15 co-pay and filled through [facility].  Rx e-scribed to [facility] and delivered to pt prior to discharge home.  No further RNCM needs identified at this time.    Maciej Schweitzer J. Lucretia Roers, RN, BSN, [place] [phone]

## 2023-06-10 NOTE — ED Notes (Signed)
Patient transported to X-ray 

## 2023-06-10 NOTE — ED Notes (Signed)
Pt received medication from pharmacy. Pt has safe ride home with family member at time of DC. Pt ambulated out of ED w steady gait, a&ox4,vss,nad.

## 2023-06-10 NOTE — ED Triage Notes (Signed)
Patient was observed what appeared to be seizure activity with convulsions, postitical for aprox according to EMS, recent seizure reported by patient "a few months ago"

## 2023-06-10 NOTE — Discharge Instructions (Signed)
He is ensure you follow-up with your primary care doctor.  Appointment scheduled and listed above.  Follow-up with your neurologist.  Please continue to take medication without any missed doses.  If you run out of this medication and cannot follow-up with your PCP or neurologist go to your nearest emergency room or urgent care to have this refilled.  For any concerning symptoms return to the emergency room.

## 2023-06-11 LAB — LEVETIRACETAM LEVEL: Levetiracetam Lvl: <2 ug/mL — ABNORMAL LOW (ref 10.0–40.0)

## 2023-06-23 ENCOUNTER — Inpatient Hospital Stay (INDEPENDENT_AMBULATORY_CARE_PROVIDER_SITE_OTHER): Payer: Self-pay | Admitting: Primary Care

## 2023-06-25 ENCOUNTER — Encounter: Payer: Self-pay | Admitting: Neurology

## 2023-06-25 ENCOUNTER — Ambulatory Visit: Payer: Self-pay | Admitting: Neurology

## 2023-07-03 ENCOUNTER — Emergency Department (HOSPITAL_COMMUNITY): Payer: Self-pay

## 2023-07-03 ENCOUNTER — Other Ambulatory Visit: Payer: Self-pay

## 2023-07-03 ENCOUNTER — Emergency Department (HOSPITAL_COMMUNITY)
Admission: EM | Admit: 2023-07-03 | Discharge: 2023-07-03 | Disposition: A | Discharge status: Home / Self Care | Payer: Self-pay | Attending: Emergency Medicine | Admitting: Emergency Medicine

## 2023-07-03 ENCOUNTER — Encounter (HOSPITAL_COMMUNITY): Payer: Self-pay

## 2023-07-03 DIAGNOSIS — W19XXXA Unspecified fall, initial encounter: Secondary | ICD-10-CM | POA: Insufficient documentation

## 2023-07-03 DIAGNOSIS — S3991XA Unspecified injury of abdomen, initial encounter: Secondary | ICD-10-CM | POA: Diagnosis present

## 2023-07-03 DIAGNOSIS — S60419A Abrasion of unspecified finger, initial encounter: Secondary | ICD-10-CM | POA: Diagnosis not present

## 2023-07-03 DIAGNOSIS — Y990 Civilian activity done for income or pay: Secondary | ICD-10-CM | POA: Insufficient documentation

## 2023-07-03 DIAGNOSIS — S301XXA Contusion of abdominal wall, initial encounter: Secondary | ICD-10-CM | POA: Insufficient documentation

## 2023-07-03 DIAGNOSIS — S0081XA Abrasion of other part of head, initial encounter: Secondary | ICD-10-CM | POA: Insufficient documentation

## 2023-07-03 DIAGNOSIS — S00512A Abrasion of oral cavity, initial encounter: Secondary | ICD-10-CM | POA: Diagnosis not present

## 2023-07-03 DIAGNOSIS — G40909 Epilepsy, unspecified, not intractable, without status epilepticus: Secondary | ICD-10-CM | POA: Insufficient documentation

## 2023-07-03 DIAGNOSIS — R569 Unspecified convulsions: Secondary | ICD-10-CM

## 2023-07-03 DIAGNOSIS — R531 Weakness: Secondary | ICD-10-CM | POA: Diagnosis not present

## 2023-07-03 DIAGNOSIS — R400 Somnolence: Secondary | ICD-10-CM | POA: Diagnosis not present

## 2023-07-03 HISTORY — DX: Unspecified convulsions: R56.9

## 2023-07-03 LAB — CBC WITH DIFFERENTIAL/PLATELET
Abs Immature Granulocytes: 0.02 10*3/uL (ref 0.00–0.07)
Basophils Absolute: 0 10*3/uL (ref 0.0–0.1)
Basophils Relative: 0 %
Eosinophils Absolute: 0.1 10*3/uL (ref 0.0–0.5)
Eosinophils Relative: 1 %
HCT: 44.4 % (ref 39.0–52.0)
Hemoglobin: 14.6 g/dL (ref 13.0–17.0)
Immature Granulocytes: 0 %
Lymphocytes Relative: 15 %
Lymphs Abs: 1.1 10*3/uL (ref 0.7–4.0)
MCH: 31.9 pg (ref 26.0–34.0)
MCHC: 32.9 g/dL (ref 30.0–36.0)
MCV: 97.2 fL (ref 80.0–100.0)
Monocytes Absolute: 0.7 10*3/uL (ref 0.1–1.0)
Monocytes Relative: 9 %
Neutro Abs: 5.6 10*3/uL (ref 1.7–7.7)
Neutrophils Relative %: 75 %
Platelets: 289 10*3/uL (ref 150–400)
RBC: 4.57 MIL/uL (ref 4.22–5.81)
RDW: 12.2 % (ref 11.5–15.5)
WBC: 7.5 10*3/uL (ref 4.0–10.5)
nRBC: 0 % (ref 0.0–0.2)

## 2023-07-03 LAB — BASIC METABOLIC PANEL
Anion gap: 10 (ref 5–15)
BUN: 9 mg/dL (ref 6–20)
CO2: 23 mmol/L (ref 22–32)
Calcium: 9.1 mg/dL (ref 8.9–10.3)
Chloride: 103 mmol/L (ref 98–111)
Creatinine, Ser: 0.97 mg/dL (ref 0.61–1.24)
GFR, Estimated: >60 mL/min (ref >60)
Glucose, Bld: 100 mg/dL — ABNORMAL HIGH (ref 70–99)
Potassium: 4.1 mmol/L (ref 3.5–5.1)
Sodium: 136 mmol/L (ref 135–145)

## 2023-07-03 LAB — CBG MONITORING, ED: Glucose-Capillary: 94 mg/dL (ref 70–99)

## 2023-07-03 MED ORDER — IOHEXOL 350 MG/ML SOLN
75.0000 mL | Freq: Once | INTRAVENOUS | Status: AC | PRN
  Administered 2023-07-03: 75 mL via INTRAVENOUS

## 2023-07-03 MED ORDER — LEVETIRACETAM IN NACL 1000 MG/100ML IV SOLN
1000.0000 mg | Freq: Once | INTRAVENOUS | Status: AC
  Administered 2023-07-03: 1000 mg via INTRAVENOUS
  Filled 2023-07-03: qty 100

## 2023-07-03 NOTE — ED Provider Notes (Addendum)
[place] EMERGENCY DEPARTMENT AT [facility] Provider Note   CSN: [id number] Arrival date & time: 07/03/23  1230     History  Chief Complaint  Patient presents with   Seizures   Leg Pain    Timothy Lynch is a 27 y.o. male.  Patient with history of seizures on levetiracetam, intermittently compliant, presents after having a seizure today.  Patient had his initial seizure in January 2024 (remote seizure reported ~ 2014).  He was actually admitted to ICU due to agitation and need for intubation.  This seizure occurred in setting of febrile illness.  Patient had EEG performed.  MRI was negative.  He was encouraged to continue on levetiracetam.  Patient represented to the emergency department on 06/10/2023 for seizure.  He was not compliant with levetiracetam at that time.  Today patient was at work.  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  Without warning, patient had a generalized seizure and woke up on an ambulance.  He was postictal with EMS.  He has recovered.  He sustained several abrasions, small bruising to the right forehead, chin.  There is a abrasion to the right lateral tongue.  He also has a few scattered abrasions on his hands.  He has swelling in the left groin area where he thinks that he landed on a pole.  This pain is 8 out of 10.  Pain is worse when he tries to flex his hip on that side.  He was ambulatory on arrival.  No headache, no vomiting.  He reports that he has not taken his Keppra in several days although he does have the medicine at home stating that he has a bottle of Keppra.       Home Medications Prior to Admission medications   Medication Sig Start Date End Date Taking? Authorizing Provider  levETIRAcetam (KEPPRA) 500 MG tablet Take 1 tablet (500 mg total) by mouth 2 (two) times daily. 06/10/23   Marita Kansas, PA-C      Allergies    Patient has no known allergies.    Review of Systems   Review of Systems  Physical  Exam Updated Vital Signs BP 132/82   Pulse 64   Temp 97.6 F (36.4 C) (Oral)   Resp 14   Ht 6\' 4"  (1.93 m)   Wt 87.5 kg   SpO2 100%   BMI 23.49 kg/m  Physical Exam Vitals and nursing note reviewed.  Constitutional:      Appearance: He is well-developed.  HENT:     Head: Normocephalic.     Comments: There is a small dime size superficial abrasion to the chin and a minimal abrasion to the right forehead as well.    Right Ear: Tympanic membrane, ear canal and external ear normal.     Left Ear: Tympanic membrane, ear canal and external ear normal.     Nose: Nose normal.     Mouth/Throat:     Pharynx: Uvula midline.     Comments: Mild lateral abrasion without laceration to the lateral tongue on the right side.  No malocclusion.  No dental injuries. Eyes:     General: Lids are normal.     Conjunctiva/sclera: Conjunctivae normal.     Pupils: Pupils are equal, round, and reactive to light.  Cardiovascular:     Rate and Rhythm: Normal rate and regular rhythm.  Pulmonary:     Effort: Pulmonary effort is normal.  Breath sounds: Normal breath sounds.  Abdominal:     Palpations: Abdomen is soft.     Tenderness: There is abdominal tenderness.     Comments: Left lower quadrant tenderness.  There is more focal swelling in the left lower abdomen and groin area.  Patient winces and withdraws on pressure over this area.  No overlying ecchymosis.  No bleeding.  He appears uncomfortable after palpation.  Musculoskeletal:        General: Normal range of motion.     Cervical back: Normal range of motion and neck supple. No tenderness or bony tenderness.     Comments: Full range of motion of the fingers, hands and wrists bilaterally, but does have some very small abrasions on some of the fingers.  Skin:    General: Skin is warm and dry.  Neurological:     Mental Status: He is alert and oriented to person, place, and time.     GCS: GCS eye subscore is 4. GCS verbal subscore is 5. GCS motor  subscore is 6.     Cranial Nerves: No cranial nerve deficit.     Sensory: No sensory deficit.     Motor: No abnormal muscle tone.     Coordination: Coordination normal.     Gait: Gait normal.     ED Results / Procedures / Treatments   Labs (all labs ordered are listed, but only abnormal results are displayed) Labs Reviewed  BASIC METABOLIC PANEL - Abnormal; Notable for the following components:      Result Value   Glucose, Bld 100 (*)    All other components within normal limits  CBC WITH DIFFERENTIAL/PLATELET  CBG MONITORING, ED    EKG None  Radiology CT ABDOMEN PELVIS W CONTRAST  Result Date: 07/03/2023 CLINICAL DATA:  Larey Seat onto pole with left groin swelling and pain. Seizure EXAM: CT ABDOMEN AND PELVIS WITH CONTRAST TECHNIQUE: Multidetector CT imaging of the abdomen and pelvis was performed using the standard protocol following bolus administration of intravenous contrast. RADIATION DOSE REDUCTION: This exam was performed according to the departmental dose-optimization program which includes automated exposure control, adjustment of the mA and/or kV according to patient size and/or use of iterative reconstruction technique. CONTRAST:  75mL OMNIPAQUE IOHEXOL 350 MG/ML SOLN COMPARISON:  None FINDINGS: Lower chest: Linear opacity lung bases likely scar or atelectasis. No pleural effusion. Of note the patient is tilted in the scanner. Some breathing motion at the lung bases. Hepatobiliary: No focal liver abnormality is seen. No gallstones, gallbladder wall thickening, or biliary dilatation. Of note the images are more in the late arterial phase than portal venous. Pancreas: Unremarkable. No pancreatic ductal dilatation or surrounding inflammatory changes. Spleen: Normal in size without focal abnormality.  Small splenule Adrenals/Urinary Tract: Adrenal glands are unremarkable. Kidneys are normal, without renal calculi, focal lesion, or hydronephrosis. Bladder is unremarkable. Stomach/Bowel:  On this non oral contrast exam the stomach has a normal course and caliber with some luminal fluid. Small bowel has a normal course and caliber. Normal appendix in the right lower quadrant, right hemipelvis extending inferior to the cecum. The large bowel has a normal course and caliber with some scattered colonic stool. Evaluation limited by motion. Vascular/Lymphatic: No significant vascular findings are present. No enlarged abdominal or pelvic lymph nodes. Reproductive: Prostate is unremarkable. Other: No free intra-air or fluid collections identified. No retroperitoneal hematoma. Musculoskeletal: There is soft tissue thickening and stranding in the perineum on the left side such as axial image 81 of series 3  which could be a component of hematoma. This is along the course of the left inguinal region. No active extravasation. No soft tissue gas. Please correlate with clinical findings. Transitional lumbosacral segment. Patient is tilted with curvature of the spine. There is truncation spinous processes in the lower thoracic region. Slight soft tissue thickening of the left rectus abdominis muscle in this location compared to right. IMPRESSION: Focal soft tissue thickening along the anterior aspect of the left perineum towards the course of the inguinal canal with stranding. Slight asymmetric thickening of the left rectus abdominis muscle focally as well. Please correlate for exact location of injury. This could be a small hematoma. No gas. Otherwise no obstruction, free air or free fluid. No evidence of solid organ injury. Patient is tilted in the scanner. Overall if there is further concern of the groin region a testicular ultrasound could be considered as clinically appropriate Electronically Signed   By: Karen Kays M.D.   On: 07/03/2023 17:26    Procedures Procedures    Medications Ordered in ED Medications  levETIRAcetam (KEPPRA) IVPB 1000 mg/100 mL premix (has no administration in time range)     ED Course/ Medical Decision Making/ A&P    Patient seen and examined. History obtained directly from patient. Work-up including labs, imaging, EKG ordered in triage, if performed, were reviewed.    Labs/EKG: Independently reviewed and interpreted.  This included: Glucose 94.  I have requested CBC and BMP.  Imaging: I have ordered CT abdomen and pelvis with contrast to evaluate for soft tissue trauma.  Patient is very tender to palpation and has some soft tissue swelling in the left groin and lower abdominal area.  Suspect just some soft tissue swelling, however will need to evaluate for internal injury.  Medications/Fluids: Ordered: IV Keppra  Most recent vital signs reviewed and are as follows: BP 132/82   Pulse 64   Temp 97.6 F (36.4 C) (Oral)   Resp 14   Ht 6\' 4"  (1.93 m)   Wt 87.5 kg   SpO2 100%   BMI 23.49 kg/m   Initial impression: Breakthrough seizure, noncompliance.  Will continue to recommend outpatient neurology follow-up.  5:44 PM Reassessment performed. Patient appears comfortable, sleeping in bed.  Arousable.  Labs personally reviewed and interpreted including: CBC unremarkable; BMP unremarkable.  Imaging personally visualized and interpreted including: CT of the abdomen and pelvis, agree shows hematoma and stranding in the groin area and perineal area consistent with soft tissue injury.  No concerning intra-abdominal findings.  Reviewed pertinent lab work and imaging with patient at bedside. Questions answered.   Most current vital signs reviewed and are as follows: BP 132/82   Pulse 64   Temp 97.6 F (36.4 C) (Oral)   Resp 14   Ht 6\' 4"  (1.93 m)   Wt 87.5 kg   SpO2 100%   BMI 23.49 kg/m   Plan: Discharge to home.   Prescriptions written for: None  Other home care instructions discussed: Very important to continue to take his home Keppra, sleep well, avoid triggers.  Discussed RICE protocol for hematoma.  ED return instructions discussed: Return  with additional seizures, new problems or concerns.  Follow-up instructions discussed: Patient encouraged to follow-up with their PCP in 3 days.  I have placed ambulatory referral to neurology as well in hopes of establishing care for seizure disorder.  Medical Decision Making Amount and/or Complexity of Data Reviewed Labs: ordered. Radiology: ordered.  Risk Prescription drug management.   Patient presents after having a seizure while working today.  He has been taking his Keppra sometimes but has not over the past several days.  Patient fell onto an object and sustained a groin hematoma.  This appears stable.  Do not suspect significant blood loss.  CT negative for more serious internal injury.  Lab workup is reassuring.  Patient was given a Keppra load in the ED.  He needs outpatient follow-up both with primary care and neurology.  He obviously needs to continue antiepileptics and compliance is certainly an issue currently.  The patient's vital signs, pertinent lab work and imaging were reviewed and interpreted as discussed in the ED course. Hospitalization was considered for further testing, treatments, or serial exams/observation. However as patient is well-appearing, has a stable exam, and reassuring studies today, I do not feel that they warrant admission at this time. This plan was discussed with the patient who verbalizes agreement and comfort with this plan and seems reliable and able to return to the Emergency Department with worsening or changing symptoms.       Final Clinical Impression(s) / ED Diagnoses Final diagnoses:  Hematoma of groin, initial encounter  Seizure (HCC)  Seizure disorder (HCC)    Rx / DC Orders ED Discharge Orders          Ordered    Ambulatory referral to Neurology       Comments: An appointment is requested in approximately: 2 weeks   07/03/23 1742             Renne Crigler, PA-C 07/03/23 1748     Benjiman Core, MD 07/04/23 [phone]

## 2023-07-03 NOTE — ED Triage Notes (Signed)
Patient BIB [facility] EMS for seizure and then injury afterwards. 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 Patient was postictal on scene but returned to GCS of 15 and A&Ox4 en route. Patient ambulatory to restroom on arrival to ED. VSS.

## 2023-07-03 NOTE — Discharge Instructions (Signed)
You should be contacted in the next couple of days by neurology in regards to a follow-up appointment.  It is very important that you continue to take your Keppra every day as instructed to help prevent seizures.  Get plenty of rest.   Your lab workup today looked okay.  Your CT scan did not show any serious internal injury.  You do have bruising and a hematoma in the groin area which is causing your pain.  Please apply ice for 15 minutes every hour, take over-the-counter medications for pain.  Standard seizure precautions: Per [employer] statutes, patients with seizures are not allowed to drive until they have been seizure-free for six months. Use caution when using heavy equipment or power tools. Avoid working on ladders or at heights. Take showers instead of baths. Ensure the water temperature is not too high on the home water heater. Do not go swimming alone. When caring for infants or small children, sit down when holding, feeding, or changing them to minimize risk of injury to the child in the event you have a seizure. To reduce risk of seizures, maintain good sleep hygiene avoid alcohol and illicit drug use, take all anti-seizure medications as prescribed.

## 2023-07-03 NOTE — ED Notes (Signed)
Patient transported to CT 

## 2023-07-08 ENCOUNTER — Inpatient Hospital Stay (INDEPENDENT_AMBULATORY_CARE_PROVIDER_SITE_OTHER): Payer: Self-pay | Admitting: Primary Care

## 2023-07-15 ENCOUNTER — Other Ambulatory Visit: Payer: Self-pay

## 2023-07-15 ENCOUNTER — Emergency Department (HOSPITAL_COMMUNITY)
Admission: EM | Admit: 2023-07-15 | Discharge: 2023-07-15 | Disposition: A | Discharge status: Home / Self Care | Payer: Medicaid Other | Attending: Emergency Medicine | Admitting: Emergency Medicine

## 2023-07-15 ENCOUNTER — Encounter (HOSPITAL_COMMUNITY): Payer: Self-pay | Admitting: Emergency Medicine

## 2023-07-15 DIAGNOSIS — Z0000 Encounter for general adult medical examination without abnormal findings: Secondary | ICD-10-CM | POA: Diagnosis not present

## 2023-07-15 DIAGNOSIS — Z139 Encounter for screening, unspecified: Secondary | ICD-10-CM

## 2023-07-15 NOTE — ED Provider Notes (Signed)
[place] EMERGENCY DEPARTMENT AT [facility] Provider Note   CSN: [id number] Arrival date & time: 07/15/23  6440     History  No chief complaint on file.   Timothy Lynch is a 27 y.o. male.  HPI   27 year old male presenting to the emergency department for a work note.  The patient states that he had a syncopal episode yesterday and was worked up at an outside emergency department, had a small laceration to his forehead that was repaired.  He was provided with a work note which stated that he was cleared to return to work today however he states that his employer was uncertain that he was cleared to come back to work.  He denies any complaints today.  He denies any difficulty with range of motion of his extremities.  He holds a sign for his work.  Home Medications Prior to Admission medications   Medication Sig Start Date End Date Taking? Authorizing Provider  levETIRAcetam (KEPPRA) 500 MG tablet Take 1 tablet (500 mg total) by mouth 2 (two) times daily. 06/10/23   Marita Kansas, PA-C      Allergies    Patient has no known allergies.    Review of Systems   Review of Systems  All other systems reviewed and are negative.   Physical Exam Updated Vital Signs BP 109/84 (BP Location: Right Arm)   Pulse 85   Temp 98.6 F (37 C)   Resp 14   Ht 6\' 4"  (1.93 m)   Wt 87.5 kg   SpO2 100%   BMI 23.48 kg/m  Physical Exam Vitals and nursing note reviewed.  Constitutional:      General: He is not in acute distress. HENT:     Head: Normocephalic.     Comments: Small 1 cm forehead laceration appears to be well-healing with sutures in place Eyes:     Conjunctiva/sclera: Conjunctivae normal.     Pupils: Pupils are equal, round, and reactive to light.  Cardiovascular:     Rate and Rhythm: Normal rate and regular rhythm.     Heart sounds: Normal heart sounds.  Pulmonary:     Effort: Pulmonary effort is normal. No respiratory distress.     Breath sounds: Normal breath  sounds.  Abdominal:     General: There is no distension.     Tenderness: There is no guarding.  Musculoskeletal:        General: No deformity or signs of injury.     Cervical back: Neck supple.     Comments: No tenderness to palpation of the extremities, no evidence of injury, range of motion intact  Skin:    Findings: No lesion or rash.  Neurological:     General: No focal deficit present.     Mental Status: He is alert. Mental status is at baseline.     Comments: No cranial nerve deficit, 5 out of 5 strength in all 4 extremities with intact sensation to light touch     ED Results / Procedures / Treatments   Labs (all labs ordered are listed, but only abnormal results are displayed) Labs Reviewed - No data to display  EKG None  Radiology No results found.  Procedures Procedures    Medications Ordered in ED Medications - No data to display  ED Course/ Medical Decision Making/ A&P  Medical Decision Making  27 year old male presenting to the emergency department for a work note.  The patient states that he had a syncopal episode yesterday and was worked up at an outside emergency department, had a small laceration to his forehead that was repaired.  He was provided with a work note which stated that he was cleared to return to work today however he states that his employer was uncertain that he was cleared to come back to work.  He denies any complaints today.  He denies any difficulty with range of motion of his extremities.  He holds a sign for his work.  On arrival, the patient was vitally stable.  Appears to have a well-healing laceration to the forehead, presenting after workup yesterday in outside emergency department for a syncopal episode, cleared for discharge and return to work today, seeking clarification on his work note today.  Patient appears medically stable and is cleared to return to work.   Final Clinical Impression(s) / ED  Diagnoses Final diagnoses:  Encounter for medical screening examination    Rx / DC Orders ED Discharge Orders     None         Ernie Avena, MD 07/15/23 [phone]

## 2023-07-15 NOTE — ED Triage Notes (Signed)
PT seen at [facility] yesterday for stitches related to a fall.  He was cleared and given a note for work but the pt needs a note stating that he can return to full duty.

## 2023-07-15 NOTE — ED Notes (Signed)
Pt verbalized understanding of discharge instructions. Pt ambulated from ed with steady gait.

## 2023-07-23 ENCOUNTER — Encounter (HOSPITAL_COMMUNITY): Payer: Self-pay

## 2023-07-23 ENCOUNTER — Ambulatory Visit (HOSPITAL_COMMUNITY)
Admission: EM | Admit: 2023-07-23 | Discharge: 2023-07-23 | Disposition: A | Discharge status: Home / Self Care | Payer: Self-pay

## 2023-07-23 DIAGNOSIS — Z4802 Encounter for removal of sutures: Secondary | ICD-10-CM

## 2023-07-23 DIAGNOSIS — S0181XD Laceration without foreign body of other part of head, subsequent encounter: Secondary | ICD-10-CM

## 2023-07-23 NOTE — ED Triage Notes (Signed)
Patient here today to have suture removed that he had placed at the hospital on 07/14/2023. Sutures are in his forehead.

## 2023-07-23 NOTE — ED Provider Notes (Signed)
[facility]    CSN: [id number] Arrival date & time: 07/23/23  1126      History   Chief Complaint Chief Complaint  Patient presents with   Suture / Staple Removal    HPI Timothy Lynch is a 27 y.o. male.  Here for suture removal  Had a syncopal episode and subsequent forehead laceration on 10/14 Evaluated and repaired at a [facility] ED He thinks there were 5 sutures placed but doesn't know for sure Not having any issues  Past Medical History:  Diagnosis Date   Seizures [facility])     Patient Active Problem List   Diagnosis Date Noted   Seizure (HCC) 10/20/2022   Humerus fracture 02/15/2013    History reviewed. No pertinent surgical history.     Home Medications    Prior to Admission medications   Medication Sig Start Date End Date Taking? Authorizing Provider  levETIRAcetam (KEPPRA) 500 MG tablet Take 1 tablet (500 mg total) by mouth 2 (two) times daily. 06/10/23   Marita Kansas, PA-C    Family History History reviewed. No pertinent family history.  Social History Social History   Tobacco Use   Smoking status: Every Day    Current packs/day: 0.50    Types: Cigarettes   Smokeless tobacco: Never  Substance Use Topics   Alcohol use: Yes   Drug use: Yes    Frequency: 7.0 times per week    Types: Marijuana     Allergies   Patient has no known allergies.   Review of Systems Review of Systems Per HPI  Physical Exam Triage Vital Signs ED Triage Vitals  Encounter Vitals Group     BP      Systolic BP Percentile      Diastolic BP Percentile      Pulse      Resp      Temp      Temp src      SpO2      Weight      Height      Head Circumference      Peak Flow      Pain Score      Pain Loc      Pain Education      Exclude from Growth Chart    No data found.  Updated Vital Signs BP 107/69 (BP Location: Left Arm)   Pulse 83   Temp 97.8 F (36.6 C) (Oral)   Resp 16   Ht 6\' 4"  (1.93 m)   Wt 189 lb (85.7 kg)   SpO2 97%   BMI 23.01  kg/m   Physical Exam Vitals and nursing note reviewed.  Constitutional:      General: He is not in acute distress. HENT:     Head: Laceration present.      Comments: Scabbed laceration on the forehead. There are 3 intact sutures noted. Cardiovascular:     Rate and Rhythm: Normal rate and regular rhythm.  Pulmonary:     Effort: Pulmonary effort is normal.  Neurological:     Mental Status: He is oriented to person, place, and time.     UC Treatments / Results  Labs (all labs ordered are listed, but only abnormal results are displayed) Labs Reviewed - No data to display  EKG  Radiology No results found.  Procedures Procedures   Medications Ordered in UC Medications - No data to display  Initial Impression / Assessment and Plan / UC Course  I have reviewed the  triage vital signs and the nursing notes.  Pertinent labs & imaging results that were available during my care of the patient were reviewed by me and considered in my medical decision making (see chart for details).  Wound well healing. No sign of infection At this time I visualize 3 intact sutures. Removed by CMA Return if needed Patient without questions at this time  Final Clinical Impressions(s) / UC Diagnoses   Final diagnoses:  Visit for suture removal  Forehead laceration, subsequent encounter     Discharge Instructions      Three sutures removed today Area is well healing  Return if needed     ED Prescriptions   None    PDMP not reviewed this encounter.   Gino Garrabrant, Lurena Joiner, [place] 07/23/23 1239

## 2023-07-23 NOTE — Discharge Instructions (Addendum)
Three sutures removed today Area is well healing  Return if needed

## 2023-07-23 NOTE — ED Notes (Signed)
This RN removed pt's sutures at this time. Spoke with Lurena Joiner PA, only 3 sutures present on assessment. 3 sutures removed, area appears to be healing well, no signs of infection.

## 2023-08-29 DIAGNOSIS — R569 Unspecified convulsions: Secondary | ICD-10-CM | POA: Diagnosis not present

## 2023-08-29 DIAGNOSIS — Z760 Encounter for issue of repeat prescription: Secondary | ICD-10-CM | POA: Diagnosis not present

## 2023-08-29 DIAGNOSIS — G40909 Epilepsy, unspecified, not intractable, without status epilepticus: Secondary | ICD-10-CM | POA: Diagnosis not present

## 2023-08-29 DIAGNOSIS — R456 Violent behavior: Secondary | ICD-10-CM | POA: Diagnosis not present

## 2023-09-03 ENCOUNTER — Ambulatory Visit: Payer: Medicaid Other | Admitting: Neurology

## 2023-10-06 DIAGNOSIS — G4089 Other seizures: Secondary | ICD-10-CM | POA: Diagnosis not present

## 2023-10-06 DIAGNOSIS — R519 Headache, unspecified: Secondary | ICD-10-CM | POA: Diagnosis not present

## 2023-10-06 DIAGNOSIS — G40909 Epilepsy, unspecified, not intractable, without status epilepticus: Secondary | ICD-10-CM | POA: Diagnosis not present

## 2023-10-06 DIAGNOSIS — R9431 Abnormal electrocardiogram [ECG] [EKG]: Secondary | ICD-10-CM | POA: Diagnosis not present

## 2023-10-06 DIAGNOSIS — G479 Sleep disorder, unspecified: Secondary | ICD-10-CM | POA: Diagnosis not present

## 2023-10-12 ENCOUNTER — Other Ambulatory Visit: Payer: Self-pay

## 2023-10-12 ENCOUNTER — Emergency Department (HOSPITAL_COMMUNITY)
Admission: EM | Admit: 2023-10-12 | Discharge: 2023-10-12 | Disposition: A | Discharge status: Home / Self Care | Payer: Medicaid Other

## 2023-10-12 DIAGNOSIS — G40909 Epilepsy, unspecified, not intractable, without status epilepticus: Secondary | ICD-10-CM | POA: Diagnosis not present

## 2023-10-12 DIAGNOSIS — R569 Unspecified convulsions: Secondary | ICD-10-CM | POA: Diagnosis not present

## 2023-10-12 DIAGNOSIS — R456 Violent behavior: Secondary | ICD-10-CM | POA: Diagnosis not present

## 2023-10-12 DIAGNOSIS — R9431 Abnormal electrocardiogram [ECG] [EKG]: Secondary | ICD-10-CM | POA: Diagnosis not present

## 2023-10-12 MED ORDER — LEVETIRACETAM 500 MG PO TABS
500.0000 mg | ORAL_TABLET | Freq: Once | ORAL | Status: AC
  Administered 2023-10-12: 500 mg via ORAL
  Filled 2023-10-12: qty 1

## 2023-10-12 NOTE — ED Triage Notes (Signed)
 Pt to ed from home via ems with CC of seizure lasting 20 mins per family. Pt is A&Ox4 Denis any complaints at this time. Pt has hx of seizure and has had his provider change his medication recently.

## 2023-10-12 NOTE — Discharge Instructions (Addendum)
 It was a pleasure caring for you.  Please follow-up with your primary care provider.  Seek emergency care experiencing any new or worsening symptoms.

## 2023-10-12 NOTE — ED Provider Notes (Signed)
 [place] EMERGENCY DEPARTMENT AT [facility] Provider Note   CSN: [id number] Arrival date & time: 10/12/23  1449     History  Chief Complaint  Patient presents with   Seizures    Timothy Lynch is a 28 y.o. male with PMHx of seizures on levetiracetam , intermittently compliant, presents after having a seizure today. Patient had his initial seizure in January 2024. Patient follows with Neurology. Apparently patient was asleep on the couch and his family members noticed that he was seizing so they called EMS. Patient stating that he woke up and found himself on the ambulance. Patient stating that he missed a dose of Keppra  this week and has not taken his dose today. Patient stating that he feels fine overall. Denies any symptoms.   Denies fever, chest pain, dyspnea, cough, nausea, vomiting, diarrhea, dysuria, hematuria, hematochezia.    Seizures      Home Medications Prior to Admission medications   Medication Sig Start Date End Date Taking? Authorizing Provider  levETIRAcetam  (KEPPRA ) 500 MG tablet Take 1 tablet (500 mg total) by mouth 2 (two) times daily. 06/10/23   Hildegard Loge, PA-C      Allergies    Patient has no known allergies.    Review of Systems   Review of Systems  Neurological:  Positive for seizures.    Physical Exam Updated Vital Signs BP 112/74   Pulse 79   Temp (!) 95.6 F (35.3 C) (Oral)   Resp 16   Ht 6' 4 (1.93 m)   Wt 85 kg   SpO2 93%   BMI 22.81 kg/m  Physical Exam Vitals and nursing note reviewed.  Constitutional:      General: He is not in acute distress.    Appearance: He is not ill-appearing or toxic-appearing.  HENT:     Head: Normocephalic and atraumatic.     Mouth/Throat:     Mouth: Mucous membranes are moist.     Comments: No tongue laceration Eyes:     General: No scleral icterus.       Right eye: No discharge.        Left eye: No discharge.     Conjunctiva/sclera: Conjunctivae normal.  Cardiovascular:     Rate  and Rhythm: Normal rate and regular rhythm.     Pulses: Normal pulses.     Heart sounds: Normal heart sounds. No murmur heard. Pulmonary:     Effort: Pulmonary effort is normal. No respiratory distress.     Breath sounds: Normal breath sounds. No wheezing, rhonchi or rales.  Abdominal:     General: Abdomen is flat. Bowel sounds are normal.     Palpations: Abdomen is soft.     Tenderness: There is no abdominal tenderness.  Musculoskeletal:     Right lower leg: No edema.     Left lower leg: No edema.  Skin:    General: Skin is warm and dry.     Findings: No rash.  Neurological:     General: No focal deficit present.     Mental Status: He is alert and oriented to person, place, and time. Mental status is at baseline.     Comments: GCS 15. Speech is goal oriented. No deficits appreciated to CN III-XII; symmetric eyebrow raise, no facial drooping, tongue midline. Patient has equal grip strength bilaterally with 5/5 strength against resistance in all major muscle groups bilaterally. Sensation to light touch intact. Patient moves extremities without ataxia. Patient ambulatory with steady gait.   Psychiatric:  Mood and Affect: Mood normal.        Behavior: Behavior normal.     ED Results / Procedures / Treatments   Labs (all labs ordered are listed, but only abnormal results are displayed) Labs Reviewed - No data to display  EKG None  Radiology No results found.  Procedures Procedures    Medications Ordered in ED Medications  levETIRAcetam  (KEPPRA ) tablet 500 mg (500 mg Oral Given 10/12/23 1548)    ED Course/ Medical Decision Making/ A&P                                 Medical Decision Making Risk Prescription drug management.   This patient presents to the ED for concern of seizure, this involves an extensive number of treatment options, and is a complaint that carries with it a high risk of complications and morbidity.  The differential diagnosis includes  seizure disorder, epilepsy, sepsis, ICH/SAH   Co morbidities that complicate the patient evaluation  Seizure disorder intermittently compliant on medications   Additional history obtained:  It does not appear that patient follows with PCP. Will refer to community clinic   Problem List / ED Course / Critical interventions / Medication management  Patient presents to ED after seizure. Patient stating that he has had multiple seizures over the past year and is intermittently complaint with Keppra . States that he missed at least one dose this week and has not taken his morning dose today. Patient stating that he otherwise feels great. Declines any infectious complaints today. Physical exam unremarkable. Talked with patient and answered all his questions. Patient stating that he is ready to go home. Provided patient with his Keppra  dose which he tolerated well. Per request of patient - I called patient's sister who agreed to come pick him up. Answered sister's questions. Sister agrees to come pick him up. Recommended following up with PCP and his neurologist. Patient verbalized understanding of plan.  Staffed with Dr. Neysa. I have reviewed the patients home medicines and have made adjustments as needed Patient afebrile with stable vitals.  Provided with return precautions.  Discharged in good condition.   Social Determinants of Health:  none         Final Clinical Impression(s) / ED Diagnoses Final diagnoses:  Seizure disorder [facility])    Rx / DC Orders ED Discharge Orders     None         Hoy Nidia FALCON, [place] 10/12/23 1615    Neysa Caron PARAS, DO 10/12/23 2025

## 2023-11-16 ENCOUNTER — Emergency Department (HOSPITAL_COMMUNITY)
Admission: EM | Admit: 2023-11-16 | Discharge: 2023-11-16 | Disposition: A | Discharge status: Home / Self Care | Payer: Medicaid Other | Attending: Emergency Medicine | Admitting: Emergency Medicine

## 2023-11-16 ENCOUNTER — Other Ambulatory Visit: Payer: Self-pay

## 2023-11-16 ENCOUNTER — Encounter (HOSPITAL_COMMUNITY): Payer: Self-pay

## 2023-11-16 DIAGNOSIS — K644 Residual hemorrhoidal skin tags: Secondary | ICD-10-CM | POA: Insufficient documentation

## 2023-11-16 DIAGNOSIS — K625 Hemorrhage of anus and rectum: Secondary | ICD-10-CM | POA: Diagnosis present

## 2023-11-16 DIAGNOSIS — K649 Unspecified hemorrhoids: Secondary | ICD-10-CM | POA: Diagnosis not present

## 2023-11-16 LAB — CBC WITH DIFFERENTIAL/PLATELET
Abs Immature Granulocytes: 0.04 10*3/uL (ref 0.00–0.07)
Basophils Absolute: 0 10*3/uL (ref 0.0–0.1)
Basophils Relative: 1 %
Eosinophils Absolute: 0.2 10*3/uL (ref 0.0–0.5)
Eosinophils Relative: 3 %
HCT: 43.9 % (ref 39.0–52.0)
Hemoglobin: 14.5 g/dL (ref 13.0–17.0)
Immature Granulocytes: 1 %
Lymphocytes Relative: 33 %
Lymphs Abs: 2.1 10*3/uL (ref 0.7–4.0)
MCH: 31.2 pg (ref 26.0–34.0)
MCHC: 33 g/dL (ref 30.0–36.0)
MCV: 94.4 fL (ref 80.0–100.0)
Monocytes Absolute: 0.8 10*3/uL (ref 0.1–1.0)
Monocytes Relative: 12 %
Neutro Abs: 3.3 10*3/uL (ref 1.7–7.7)
Neutrophils Relative %: 50 %
Platelets: 235 10*3/uL (ref 150–400)
RBC: 4.65 MIL/uL (ref 4.22–5.81)
RDW: 12.7 % (ref 11.5–15.5)
WBC: 6.5 10*3/uL (ref 4.0–10.5)
nRBC: 0 % (ref 0.0–0.2)

## 2023-11-16 LAB — BASIC METABOLIC PANEL
Anion gap: 10 (ref 5–15)
BUN: 9 mg/dL (ref 6–20)
CO2: 25 mmol/L (ref 22–32)
Calcium: 9.2 mg/dL (ref 8.9–10.3)
Chloride: 104 mmol/L (ref 98–111)
Creatinine, Ser: 0.89 mg/dL (ref 0.61–1.24)
GFR, Estimated: >60 mL/min (ref >60)
Glucose, Bld: 111 mg/dL — ABNORMAL HIGH (ref 70–99)
Potassium: 3.8 mmol/L (ref 3.5–5.1)
Sodium: 139 mmol/L (ref 135–145)

## 2023-11-16 NOTE — ED Triage Notes (Signed)
 Pt c/o dark red blood coming from rectumx2d. Pt states rectum feels swollen when he sits down in chair. Pt denies anal sex.

## 2023-11-16 NOTE — ED Provider Notes (Signed)
 [place] EMERGENCY DEPARTMENT AT [facility] Provider Note   CSN: [id number] Arrival date & time: 11/16/23  1207     History  Chief Complaint  Patient presents with   Rectal Bleeding    Timothy Lynch is a 28 y.o. male.  28 year old male with prior medical history as detailed below presents for evaluation.  Patient complains of intermittent bleeding from his rectum with bowel movements.  This has been an issue for the last 2 or 3 days.  He reports that his symptoms have improved over the last 24 hours.  He complains of itching discomfort at his rectum.  He reports seeing some bright red blood with attempted BM.  This is most noticeable with wiping.  He denies nausea or vomiting.  He denies abdominal pain.  He denies fever.  He denies anal intercourse or introduction of foreign body into the anus.  The history is provided by the patient and medical records.       Home Medications Prior to Admission medications   Medication Sig Start Date End Date Taking? Authorizing Provider  levETIRAcetam (KEPPRA) 500 MG tablet Take 1 tablet (500 mg total) by mouth 2 (two) times daily. 06/10/23   Marita Kansas, PA-C      Allergies    Patient has no known allergies.    Review of Systems   Review of Systems  All other systems reviewed and are negative.   Physical Exam Updated Vital Signs BP 125/88 (BP Location: Right Arm)   Pulse 74   Temp 98.1 F (36.7 C)   Resp 16   Ht 6\' 4"  (1.93 m)   Wt 85 kg   SpO2 100%   BMI 22.81 kg/m  Physical Exam Vitals and nursing note reviewed.  Constitutional:      General: He is not in acute distress.    Appearance: He is well-developed.  HENT:     Head: Normocephalic and atraumatic.  Eyes:     Conjunctiva/sclera: Conjunctivae normal.  Cardiovascular:     Rate and Rhythm: Normal rate and regular rhythm.     Heart sounds: No murmur heard. Pulmonary:     Effort: Pulmonary effort is normal. No respiratory distress.     Breath sounds:  Normal breath sounds.  Abdominal:     Palpations: Abdomen is soft.     Tenderness: There is no abdominal tenderness.  Genitourinary:    Comments: Small hemorrhoidal tag noted at right.  Evidence of recent bleeding noted.  No abscess or other significant acute findings noted. Musculoskeletal:        General: No swelling.     Cervical back: Neck supple.  Skin:    General: Skin is warm and dry.     Capillary Refill: Capillary refill takes less than 2 seconds.  Neurological:     Mental Status: He is alert.  Psychiatric:        Mood and Affect: Mood normal.     ED Results / Procedures / Treatments   Labs (all labs ordered are listed, but only abnormal results are displayed) Labs Reviewed  CBC WITH DIFFERENTIAL/PLATELET  BASIC METABOLIC PANEL    EKG None  Radiology No results found.  Procedures Procedures    Medications Ordered in ED Medications - No data to display  ED Course/ Medical Decision Making/ A&P  Medical Decision Making   Medical Screen Complete  This patient presented to the ED with complaint of rectal bleeding.  This complaint involves an extensive number of treatment options. The initial differential diagnosis includes, but is not limited to, hemorrhoidal bleeding  This presentation is: Acute, Chronic, Self-Limited, Previously Undiagnosed, and Uncertain Prognosis  Patient is presenting with rectal bleeding.  On exam, hemorrhoidal tag identified with signs of recent bleeding.  Screening labs identified no other acute pathology.  Patient educated about hemorrhoids and how to care for them at home.  Importance of close follow-up stressed.  Strict return precautions given and understood.  Co morbidities that complicated the patient's evaluation  The HPI   Additional history obtained: External records from outside sources obtained and reviewed including prior ED visits and prior Inpatient records.    Lab  Tests:  I ordered and personally interpreted labs.  The pertinent results include: CBC, BMP  Problem List / ED Course:  Hemorrhoids   Reevaluation:  After the interventions noted above, I reevaluated the patient and found that they have: improved   Disposition:  After consideration of the diagnostic results and the patients response to treatment, I feel that the patent would benefit from close outpatient follow-up.          Final Clinical Impression(s) / ED Diagnoses Final diagnoses:  Hemorrhoids, unspecified hemorrhoid type    Rx / DC Orders ED Discharge Orders     None         Wynetta Fines, MD 11/16/23 1528

## 2023-11-16 NOTE — ED Provider Triage Note (Signed)
 Emergency Medicine Provider Triage Evaluation Note  Timothy Lynch , a 28 y.o. male  was evaluated in triage.  Pt complains of rectal bleeding  Review of Systems  Positive: Rectal bleeding  Negative: Vomiting or diarrhea   Physical Exam  BP 125/88 (BP Location: Right Arm)   Pulse 74   Temp 98.1 F (36.7 C)   Resp 16   Ht 6\' 4"  (1.93 m)   Wt 85 kg   SpO2 100%   BMI 22.81 kg/m  Gen:   Awake, no distress   Resp:  Normal effort  MSK:   Moves extremities without difficulty  Other:    Medical Decision Making  Medically screening exam initiated at 12:25 PM.  Appropriate orders placed.  JATAVION PEASTER was informed that the remainder of the evaluation will be completed by another provider, this initial triage assessment does not replace that evaluation, and the importance of remaining in the ED until their evaluation is complete.     Elson Areas, [place] 11/16/23 1226

## 2023-11-16 NOTE — Discharge Instructions (Addendum)
 Return for any problem.  Use Preparation H lotion, hot baths, consume fluids and fiber as instructed to take care of your hemorrhoids.

## 2023-12-04 ENCOUNTER — Encounter: Payer: Self-pay | Admitting: Neurology

## 2023-12-04 ENCOUNTER — Ambulatory Visit: Payer: Medicaid Other | Admitting: Neurology

## 2023-12-04 VITALS — BP 118/77 | HR 91 | Ht 75.0 in | Wt 207.5 lb

## 2023-12-04 DIAGNOSIS — G40909 Epilepsy, unspecified, not intractable, without status epilepticus: Secondary | ICD-10-CM

## 2023-12-04 MED ORDER — LAMOTRIGINE 25 MG PO TABS: ORAL_TABLET | ORAL | 0 refills | Status: DC

## 2023-12-04 NOTE — Patient Instructions (Addendum)
 Continue with Levetiracetam 500 mg twice daily  Start lamotrigine as below  Week 1: 25mg  (one pill) at night  Week 2: 25mg  (one pill) twice daily Week 3: 50 mg (two pills) twice daily  Week 4: 75 mg (three pills) twice daily Week 5: 100 mg (fours pills) twice daily Will obtain a Lamotrigine level (blood test) and complete metabolic panel after week 6  Please stop the medication and call the office as soon as you develop a new rash Routine EEG, I will contact you to go over the results  Please call for any seizures or any other concerns.

## 2023-12-04 NOTE — Progress Notes (Signed)
 [facility]  PATIENT: Timothy Lynch DOB: 05-22-1996  REQUESTING CLINICIAN: Renne Crigler, PA-C HISTORY FROM: Patient/Chart review  REASON FOR VISIT: New onset seizures    HISTORICAL  CHIEF COMPLAINT:  Chief Complaint  Patient presents with   Room 13    Pt is here Alone. Pt's last Seizure was 10/12/23. Pt states that he has tremors. Pt states that the Keppra gives him some crazy dreams as well as gives him anxiety. Pt states that every time he has a seizure he has hit his head.     HISTORY OF PRESENT ILLNESS:  This is a 28 year old gentleman with no reported past medical history who is presenting to establish care for his new onset seizure disorder.  Patient reports his first seizure occurred in January 2024.  He tells me that he does not remember exactly what happened but he was told that he had a seizure and was very combative. He was admitted to hospital for few days had full workup, including LP, seizure etiology was not established.  He was discharged home on Keppra.  He tells me since then he had a total of 6 or 7 seizures, the last seizure was in January 2025.  He also reports that he has been not adherent with his Keppra causing him to have breakthrough seizures.  He tells me since his last seizure in January 2025, he has been more consistent with the Keppra but it is given him vivid dreams, almost every night and extreme anxiety, but he does report taking it twice a day.  With his seizures, he did have a laceration and tongue biting and urinary incontinence.  He does have a family history of seizure including his paternal cousin and uncle.   Handedness: Left handed   Onset: January 2024  Seizure Type: Generalized convulsion  Current frequency: A total of 7 seizure, last one 10/12/2023  Any injuries from seizures: Fall, injury with laceration, tongue biting, urinary incontinence  Seizure risk factors: Paternal cousin and Paternal uncle   Previous ASMs:  Levetiracetam   Currenty ASMs: Levetiracetam 500 mg BID   ASMs side effects: Anxiety, Vivid dream   Brain Images: Normal   Previous EEGs: Diffuse slowing    OTHER MEDICAL CONDITIONS: Seizures, Anxiety  REVIEW OF SYSTEMS: Full 14 system review of systems performed and negative with exception of: As noted in the HPI   ALLERGIES: No Known Allergies  HOME MEDICATIONS: Outpatient Medications Prior to Visit  Medication Sig Dispense Refill   levETIRAcetam (KEPPRA) 500 MG tablet Take 1 tablet (500 mg total) by mouth 2 (two) times daily. 60 tablet 1   No facility-administered medications prior to visit.    PAST MEDICAL HISTORY: Past Medical History:  Diagnosis Date   Seizures (HCC)     PAST SURGICAL HISTORY: History reviewed. No pertinent surgical history.  FAMILY HISTORY: Family History  Problem Relation Age of Onset   Seizures Paternal Uncle    Seizures Cousin     SOCIAL HISTORY: Social History   Socioeconomic History   Marital status: Single    Spouse name: Not on file   Number of children: Not on file   Years of education: Not on file   Highest education level: Not on file  Occupational History   Not on file  Tobacco Use   Smoking status: Every Day    Current packs/day: 0.50    Types: Cigarettes   Smokeless tobacco: Never  Substance and Sexual Activity   Alcohol use: Not Currently  Drug use: Yes    Frequency: 7.0 times per week    Types: Marijuana   Sexual activity: Yes  Other Topics Concern   Not on file  Social History Narrative   Not on file   Social Drivers of Health   Financial Resource Strain: Not on file  Food Insecurity: No Food Insecurity (10/24/2022)   Hunger Vital Sign    Worried About Running Out of Food in the Last Year: Never true    Ran Out of Food in the Last Year: Never true  Transportation Needs: No Transportation Needs (10/24/2022)   PRAPARE - Administrator, Civil Service (Medical): No    Lack of Transportation  (Non-Medical): No  Physical Activity: Not on file  Stress: Not on file  Social Connections: Not on file  Intimate Partner Violence: Not At Risk (10/06/2023)   Received from [facility]   HITS    Over the last 12 months how often did your partner physically hurt you?: Never    Over the last 12 months how often did your partner insult you or talk down to you?: Never    Over the last 12 months how often did your partner threaten you with physical harm?: Never    Over the last 12 months how often did your partner scream or curse at you?: Never    PHYSICAL EXAM  GENERAL EXAM/CONSTITUTIONAL: Vitals:  Vitals:   12/04/23 1040  BP: 118/77  Pulse: 91  Weight: 207 lb 8 oz (94.1 kg)  Height: 6\' 3"  (1.905 m)   Body mass index is 25.94 kg/m. Wt Readings from Last 3 Encounters:  12/04/23 207 lb 8 oz (94.1 kg)  11/16/23 187 lb 6.3 oz (85 kg)  10/12/23 187 lb 6.3 oz (85 kg)   Patient is in no distress; well developed, nourished and groomed; neck is supple  MUSCULOSKELETAL: Gait, strength, tone, movements noted in Neurologic exam below  NEUROLOGIC: MENTAL STATUS:      No data to display         awake, alert, oriented to person, place and time recent and remote memory intact normal attention and concentration language fluent, comprehension intact, naming intact fund of knowledge appropriate  CRANIAL NERVE:  2nd, 3rd, 4th, 6th - Visual fields full to confrontation, extraocular muscles intact, no nystagmus 5th - facial sensation symmetric 7th - facial strength symmetric 8th - hearing intact 9th - palate elevates symmetrically, uvula midline 11th - shoulder shrug symmetric 12th - tongue protrusion midline  MOTOR:  normal bulk and tone, full strength in the BUE, BLE  SENSORY:  normal and symmetric to light touch  COORDINATION:  finger-nose-finger, fine finger movements normal  GAIT/STATION:  normal   DIAGNOSTIC DATA (LABS, IMAGING, TESTING) - I reviewed patient  records, labs, notes, testing and imaging myself where available.  Lab Results  Component Value Date   WBC 6.5 11/16/2023   HGB 14.5 11/16/2023   HCT 43.9 11/16/2023   MCV 94.4 11/16/2023   PLT 235 11/16/2023      Component Value Date/Time   NA 139 11/16/2023 1232   K 3.8 11/16/2023 1232   CL 104 11/16/2023 1232   CO2 25 11/16/2023 1232   GLUCOSE 111 (H) 11/16/2023 1232   BUN 9 11/16/2023 1232   CREATININE 0.89 11/16/2023 1232   CALCIUM 9.2 11/16/2023 1232   PROT 6.9 06/10/2023 0640   ALBUMIN 3.8 06/10/2023 0640   AST 23 06/10/2023 0640   ALT 16 06/10/2023 0640   ALKPHOS  57 06/10/2023 0640   BILITOT 1.6 (H) 06/10/2023 0640   GFRNONAA >60 11/16/2023 1232   GFRAA >60 03/22/2019 0438   Lab Results  Component Value Date   TRIG 217 (H) 10/21/2022   Lab Results  Component Value Date   HGBA1C 4.6 11/07/2022   No results found for: "VITAMINB12" No results found for: "TSH"  MRI Brain 10/20/2022 Normal brain MRI. No evidence of acute intracranial abnormality.   EEG 10/20/2022:  Continuous slow, generalized   I personally reviewed brain Images.   ASSESSMENT AND PLAN  28 y.o. year old male  with no reported past medical history who is presenting with new onset epilepsy.  He is currently on Keppra 500 mg twice daily but does have side effect of vivid dreams and extreme anxiety, plan will be to switch him from Keppra to lamotrigine.  I gave him a titration plan for the next 6 weeks.  Patient will present in 6 weeks to obtain blood work.  I did advise him to contact me if he does have a breakthrough seizure or any side effect from the medication.  He voiced understanding.   1. Nonintractable epilepsy without status epilepticus, unspecified epilepsy type [facility])     Patient Instructions  Continue with Levetiracetam 500 mg twice daily  Start lamotrigine as below  Week 1: 25mg  (one pill) at night  Week 2: 25mg  (one pill) twice daily Week 3: 50 mg (two pills) twice daily  Week  4: 75 mg (three pills) twice daily Week 5: 100 mg (fours pills) twice daily Will obtain a Lamotrigine level (blood test) and complete metabolic panel after week 6  Please stop the medication and call the office as soon as you develop a new rash Routine EEG, I will contact you to go over the results  Please call for any seizures or any other concerns.    Per [facility] statutes, patients with seizures are not allowed to drive until they have been seizure-free for six months.  Other recommendations include using caution when using heavy equipment or power tools. Avoid working on ladders or at heights. Take showers instead of baths.  Do not swim alone.  Ensure the water temperature is not too high on the home water heater. Do not go swimming alone. Do not lock yourself in a room alone (i.e. bathroom). When caring for infants or small children, sit down when holding, feeding, or changing them to minimize risk of injury to the child in the event you have a seizure. Maintain good sleep hygiene. Avoid alcohol.  Also recommend adequate sleep, hydration, good diet and minimize stress.   During the Seizure  - First, ensure adequate ventilation and place patients on the floor on their left side  Loosen clothing around the neck and ensure the airway is patent. If the patient is clenching the teeth, do not force the mouth open with any object as this can cause severe damage - Remove all items from the surrounding that can be hazardous. The patient may be oblivious to what's happening and may not even know what he or she is doing. If the patient is confused and wandering, either gently guide him/her away and block access to outside areas - Reassure the individual and be comforting - Call 911. In most cases, the seizure ends before EMS arrives. However, there are cases when seizures may last over 3 to 5 minutes. Or the individual may have developed breathing difficulties or severe injuries. If a pregnant  patient  or a person with diabetes develops a seizure, it is prudent to call an ambulance. - Finally, if the patient does not regain full consciousness, then call EMS. Most patients will remain confused for about 45 to 90 minutes after a seizure, so you must use judgment in calling for help. - Avoid restraints but make sure the patient is in a bed with padded side rails - Place the individual in a lateral position with the neck slightly flexed; this will help the saliva drain from the mouth and prevent the tongue from falling backward - Remove all nearby furniture and other hazards from the area - Provide verbal assurance as the individual is regaining consciousness - Provide the patient with privacy if possible - Call for help and start treatment as ordered by the caregiver   After the Seizure (Postictal Stage)  After a seizure, most patients experience confusion, fatigue, muscle pain and/or a headache. Thus, one should permit the individual to sleep. For the next few days, reassurance is essential. Being calm and helping reorient the person is also of importance.  Most seizures are painless and end spontaneously. Seizures are not harmful to others but can lead to complications such as stress on the lungs, brain and the heart. Individuals with prior lung problems may develop labored breathing and respiratory distress.    Discussed Patients with epilepsy have a small risk of sudden unexpected death, a condition referred to as sudden unexpected death in epilepsy (SUDEP). SUDEP is defined specifically as the sudden, unexpected, witnessed or unwitnessed, nontraumatic and nondrowning death in patients with epilepsy with or without evidence for a seizure, and excluding documented status epilepticus, in which post mortem examination does not reveal a structural or toxicologic cause for death     Orders Placed This Encounter  Procedures   EEG adult    Meds ordered this encounter  Medications    lamoTRIgine (LAMICTAL) 25 MG tablet    Sig: Take 1 tablet (25 mg total) by mouth every evening for 7 days, THEN 1 tablet (25 mg total) 2 (two) times daily for 7 days, THEN 2 tablets (50 mg total) 2 (two) times daily for 7 days, THEN 3 tablets (75 mg total) 2 (two) times daily for 7 days, THEN 4 tablets (100 mg total) 2 (two) times daily for 14 days.    Dispense:  240 tablet    Refill:  0    Return in about 6 weeks (around 01/15/2024).    Windell Norfolk, MD 12/05/2023, 1:41 PM  [facility] [address], [address] [place], [place] [address] [phone]

## 2023-12-15 ENCOUNTER — Ambulatory Visit: Admitting: Neurology

## 2023-12-15 ENCOUNTER — Telehealth: Payer: Self-pay

## 2023-12-15 ENCOUNTER — Other Ambulatory Visit: Payer: Self-pay | Admitting: *Deleted

## 2023-12-15 DIAGNOSIS — G40909 Epilepsy, unspecified, not intractable, without status epilepticus: Secondary | ICD-10-CM | POA: Diagnosis not present

## 2023-12-15 NOTE — Telephone Encounter (Signed)
 Call to patient, he says he is taking the Keppra as prescribed. He has not picked up the lamictal yet. I called the pharmacy to verify it was there and they are filling. Also call to patients dad at [phone] (given by patient) no answer and unable to leave message. Patient stated he had mychart but looked as if had been declined. I resent activation text link.

## 2023-12-15 NOTE — Procedures (Signed)
    History:  28 year old man with epilepsy   EEG classification: Awake and drowsy  Duration: 25 minutes   Technical aspects: This EEG study was done with scalp electrodes positioned according to the 10-20 International system of electrode placement. Electrical activity was reviewed with band pass filter of 1-70Hz , sensitivity of 7 uV/mm, display speed of 57mm/sec with a 60Hz  notched filter applied as appropriate. EEG data were recorded continuously and digitally stored.   Description of the recording: The background rhythms of this recording consists of a fairly well modulated medium amplitude alpha rhythm of 10 Hz that is reactive to eye opening and closure. Present in the anterior head region is a 15-20 Hz beta activity. Photic stimulation was performed, did not show any abnormalities. Hyperventilation was also performed, did not show any abnormalities. Drowsiness was manifested by background fragmentation. No abnormal epileptiform discharges seen during this recording. There was no focal slowing. There were no electrographic seizure identified.   Abnormality: None   Impression: This is a normal awake and drowsy EEG. No evidence of interictal epileptiform discharges. Normal EEGs, however, do not rule out epilepsy.    Windell Norfolk, MD [facility]

## 2024-01-14 ENCOUNTER — Other Ambulatory Visit: Payer: Self-pay | Admitting: Neurology

## 2024-01-14 MED ORDER — LAMOTRIGINE 25 MG PO TABS: ORAL_TABLET | ORAL | 0 refills | Status: DC

## 2024-01-14 MED ORDER — VALTOCO 20 MG DOSE 2 X 10 MG/0.1ML NA LQPK: NASAL | 3 refills | Status: AC

## 2024-01-14 NOTE — Telephone Encounter (Signed)
 Pt would like a call back to speak with April to confirm can come to appointment. Patient do not want to cancel appointment.

## 2024-01-14 NOTE — Telephone Encounter (Signed)
 Called pt to confirm appt for 01/15/2024. Pt wanted to inform Dr. Samara Crest he has not been able to start on medication lamoTRIgine (LAMICTAL) 25 MG tablet  yet because there was an issue with the pharmacy.

## 2024-01-14 NOTE — Telephone Encounter (Signed)
 Call to pharmacy, spoke to [place]. She states they was no issues with insurance , medication was filled but never picked up. They will refill and get ready for pick up today. Tried to call patient, no answer, unable to leave message. Call to sister, phone call didn't go through. Call to Dad on DPR, reviewed Lamictal titration and will [facility] per dad requests. Advised that appointment would be cancelled tomorrow and lab work would needed in 6 weeks. Dad verbalized understanding.   Call back to [facility] pharmacy and spoke with courtney, advised to cancel order. Patient getting at [facility] [address]

## 2024-01-15 ENCOUNTER — Ambulatory Visit: Admitting: Neurology

## 2024-02-05 ENCOUNTER — Telehealth: Payer: Self-pay | Admitting: Neurology

## 2024-02-05 MED ORDER — LEVETIRACETAM 500 MG PO TABS: 500.0000 mg | ORAL_TABLET | Freq: Two times a day (BID) | ORAL | 1 refills | Status: DC

## 2024-02-05 NOTE — Telephone Encounter (Signed)
 refilled

## 2024-02-05 NOTE — Telephone Encounter (Signed)
 Pt states Dr Samara Crest told him he would manage his levETIRAcetam  (KEPPRA ) 500 MG tablet , pt is asking this be called into [facility] [address]  [phone]

## 2024-02-10 ENCOUNTER — Ambulatory Visit: Payer: Medicaid Other | Admitting: Emergency Medicine

## 2024-02-10 ENCOUNTER — Encounter: Payer: Self-pay | Admitting: Emergency Medicine

## 2024-02-10 VITALS — BP 110/70 | HR 71 | Temp 98.2°F | Ht 75.0 in | Wt 209.0 lb

## 2024-02-10 DIAGNOSIS — Z7689 Persons encountering health services in other specified circumstances: Secondary | ICD-10-CM

## 2024-02-10 DIAGNOSIS — K649 Unspecified hemorrhoids: Secondary | ICD-10-CM | POA: Diagnosis not present

## 2024-02-10 DIAGNOSIS — R569 Unspecified convulsions: Secondary | ICD-10-CM

## 2024-02-10 MED ORDER — HYDROCORTISONE (PERIANAL) 2.5 % EX CREA
1.0000 | TOPICAL_CREAM | Freq: Two times a day (BID) | CUTANEOUS | 2 refills | Status: AC
Start: 2024-02-10 — End: ?

## 2024-02-10 NOTE — Progress Notes (Signed)
 Timothy Lynch 28 y.o.   Chief Complaint  Patient presents with   Establish Care    Patient here to establish care and following up with his seizures. He states since last year 9 seizures. He does have a neurologist here in [place] Dr. Samara Crest. He mentions he's been having shakes that feels like sharp pains that has been happening more often     HISTORY OF PRESENT ILLNESS: This is a 28 y.o. male first visit to this office, here to establish care with me History of seizure disorder that started less than a year ago Follows up with neurologist on a regular basis.  Recently started on Lamictal  and Keppra .  Still adjusting doses. Also complaining of hemorrhoids, requesting cream. No other complaint or medical concerns today. Last neurology office visit assessment and plan as follows: ASSESSMENT AND PLAN   28 y.o. year old male  with no reported past medical history who is presenting with new onset epilepsy.  He is currently on Keppra  500 mg twice daily but does have side effect of vivid dreams and extreme anxiety, plan will be to switch him from Keppra  to lamotrigine .  I gave him a titration plan for the next 6 weeks.  Patient will present in 6 weeks to obtain blood work.  I did advise him to contact me if he does have a breakthrough seizure or any side effect from the medication.  He voiced understanding.     1. Nonintractable epilepsy without status epilepticus, unspecified epilepsy type (HCC)     HPI   Prior to Admission medications   Medication Sig Start Date End Date Taking? Authorizing Provider  diazePAM, 20 MG Dose, (VALTOCO 20 MG DOSE) 2 x 10 MG/0.1ML LQPK Spray 1 vial (10 mg) into each nostril at onset on convulsing and call 911 01/14/24  Yes Camara, Amadou, MD  hydrocortisone (ANUSOL-HC) 2.5 % rectal cream Place 1 Application rectally 2 (two) times daily. 02/10/24  Yes Jakeel Starliper, Isidro Margo, MD  lamoTRIgine  (LAMICTAL ) 25 MG tablet Take 1 tablet (25 mg total) by mouth every  evening for 7 days, THEN 1 tablet (25 mg total) 2 (two) times daily for 7 days, THEN 2 tablets (50 mg total) 2 (two) times daily for 7 days, THEN 3 tablets (75 mg total) 2 (two) times daily for 7 days, THEN 4 tablets (100 mg total) 2 (two) times daily for 14 days. 01/14/24 02/25/24 Yes Camara, Amadou, MD  levETIRAcetam  (KEPPRA ) 500 MG tablet Take 1 tablet (500 mg total) by mouth 2 (two) times daily. 02/05/24  Yes Cassandra Cleveland, MD    No Known Allergies  Patient Active Problem List   Diagnosis Date Noted   Seizure (HCC) 10/20/2022    Past Medical History:  Diagnosis Date   Seizures (HCC)     History reviewed. No pertinent surgical history.  Social History   Socioeconomic History   Marital status: Single    Spouse name: Not on file   Number of children: 1   Years of education: Not on file   Highest education level: Some college, no degree  Occupational History   Not on file  Tobacco Use   Smoking status: Every Day    Current packs/day: 0.50    Types: Cigarettes   Smokeless tobacco: Never  Vaping Use   Vaping status: Never Used  Substance and Sexual Activity   Alcohol use: Yes    Alcohol/week: 1.0 standard drink of alcohol    Types: 1 Cans of beer per week  Comment: not often   Drug use: Yes    Frequency: 7.0 times per week    Types: Marijuana    Comment: every other day   Sexual activity: Yes    Birth control/protection: Condom  Other Topics Concern   Not on file  Social History Narrative   Not on file   Social Drivers of Health   Financial Resource Strain: Not on file  Food Insecurity: No Food Insecurity (10/24/2022)   Hunger Vital Sign    Worried About Running Out of Food in the Last Year: Never true    Ran Out of Food in the Last Year: Never true  Transportation Needs: No Transportation Needs (10/24/2022)   PRAPARE - Administrator, Civil Service (Medical): No    Lack of Transportation (Non-Medical): No  Physical Activity: Not on file  Stress:  Not on file  Social Connections: Not on file  Intimate Partner Violence: Not At Risk (10/06/2023)   Received from [facility]   HITS    Over the last 12 months how often did your partner physically hurt you?: Never    Over the last 12 months how often did your partner insult you or talk down to you?: Never    Over the last 12 months how often did your partner threaten you with physical harm?: Never    Over the last 12 months how often did your partner scream or curse at you?: Never    Family History  Problem Relation Age of Onset   Lupus Mother    Diabetes Father    Heart disease Father    Coronary artery disease Father    Gout Father    Heart attack Father    Transient ischemic attack Father    Coronary artery disease Brother    Seizures Paternal Uncle    Kidney disease Maternal Grandmother    Depression Maternal Grandmother    Lung disease Paternal Grandfather    Heart attack Paternal Grandfather    Seizures Cousin      Review of Systems  Constitutional: Negative.  Negative for chills and fever.  HENT: Negative.  Negative for congestion and sore throat.   Respiratory: Negative.  Negative for cough and shortness of breath.   Cardiovascular: Negative.  Negative for chest pain and palpitations.  Gastrointestinal:  Negative for abdominal pain, diarrhea, nausea and vomiting.  Genitourinary: Negative.  Negative for dysuria and hematuria.  Skin: Negative.  Negative for rash.  Neurological:  Negative for dizziness and headaches.  All other systems reviewed and are negative.   Vitals:   02/10/24 1109  BP: 110/70  Pulse: 71  Temp: 98.2 F (36.8 C)  SpO2: 97%    Physical Exam Vitals reviewed.  Constitutional:      Appearance: Normal appearance.  HENT:     Head: Normocephalic.     Mouth/Throat:     Mouth: Mucous membranes are moist.     Pharynx: Oropharynx is clear.  Eyes:     Extraocular Movements: Extraocular movements intact.     Pupils: Pupils are equal, round,  and reactive to light.  Cardiovascular:     Rate and Rhythm: Normal rate and regular rhythm.     Pulses: Normal pulses.     Heart sounds: Normal heart sounds.  Pulmonary:     Effort: Pulmonary effort is normal.     Breath sounds: Normal breath sounds.  Musculoskeletal:     Cervical back: No tenderness.  Lymphadenopathy:     Cervical:  No cervical adenopathy.  Skin:    General: Skin is warm and dry.     Capillary Refill: Capillary refill takes less than 2 seconds.  Neurological:     General: No focal deficit present.     Mental Status: He is alert and oriented to person, place, and time.  Psychiatric:        Mood and Affect: Mood normal.        Behavior: Behavior normal.      ASSESSMENT & PLAN: A total of 48 minutes was spent with the patient and counseling/coordination of care regarding preparing for this visit, review of available medical records, review of neurologist office visit notes, establishing care with me, comprehensive history and physical examination, review of all medications, review of most recent bloodwork results, review of health maintenance items, education on nutrition, prognosis, documentation, and need for follow up.   Problem List Items Addressed This Visit       Cardiovascular and Mediastinum   Hemorrhoids   Hemorrhoid management discussed Recommend Anusol-HC cream as needed. Stable. No complications.      Relevant Medications   hydrocortisone (ANUSOL-HC) 2.5 % rectal cream     Other   Seizure (HCC) - Primary   Clinically stable.  New onset seizures. Follows up with neurologist on a regular basis Presently on Keppra  500 mg twice a day and Lamictal  titrating doses up over the next several weeks. Seizure precautions given ED precautions given Diet and nutrition discussed      Other Visit Diagnoses       Encounter to establish care          Patient Instructions  Seizure, Adult A seizure is a sudden burst of abnormal activity in the  brain. Seizures usually last from 30 seconds to 2 minutes. There are many types of seizures. And they can cause many different symptoms. What are the causes? Common causes of a seizure include: Fever or infection. Problems that affect the brain. These may include: A brain or head injury. A stroke. A brain tumor. Low levels of blood sugar or salt. Kidney problems or liver problems. Some inherited conditions. These are passed down from parent to child. Problems with a substance, such as: Having a reaction to a drug or a medicine. Stopping the use of a substance all of a sudden. When this causes problems, it's called withdrawal. Disorders that affect how you develop, such as autism spectrum disorder or cerebral palsy. Sometimes, the cause may not be known. Some people who have a seizure never have another one. A person who has repeated seizures over time without a clear cause has a condition called epilepsy. What increases the risk? Having a family history of epilepsy. Having had a tonic-clonic seizure before. This type of seizure causes: The muscles of the whole body to tighten, or contract. Loss of consciousness. Having a head injury or a stroke in the past. Having had too little oxygen at birth. What are the signs or symptoms? The symptoms vary depending on the type of seizure you have. Symptoms during a seizure Having convulsions. This means shaking with fast, jerky movements of muscles. Stiffness of the body. Breathing problems. Being confused. Staring or not responding to sound or touch. Head nodding, eye blinking, eye twitching, or fast eye movements. Drooling, grunting, or making clicking sounds with your mouth. Losing control of when you pee or poop. Symptoms before a seizure Feeling afraid, worried, or nervous. Feeling like you may vomit. Vertigo. This feels like: You are  moving when you're not. Things around you are moving when they're not. Dj vu. This is a feeling  of having seen or heard something before. Odd tastes or smells. Changes in how you see. You may see flashing lights or spots. Symptoms after a seizure Being confused. Feeling sleepy. Headache. Sore muscles. How is this diagnosed? A seizure may be diagnosed based on: A description of your symptoms. Video of your seizures can be helpful. Your medical history. A physical exam. Tests, such as: Blood tests. CT scan. MRI. Electroencephalogram, or EEG. This test measures electrical activity in the brain. A test of your spinal fluid. This is called a spinal tap or lumbar puncture. How is this treated? If your seizure stops on its own, you will not need treatment. If your seizure lasts longer than 5 minutes, you'll normally need treatment. This may include: Medicines given through an IV. Avoiding things, such as medicines, that are known to cause your seizures. Medicines to prevent seizures. These are called antiepileptics. A device to prevent or control seizures. Eating foods that are low in carbohydrates and high in fat (ketogenic diet). Surgery. This is sometimes needed if you keep having seizures. Follow these instructions at home: Medicines Take your medicines only as told by your health care provider. Avoid anything that may keep your medicine from working, such as alcohol. Activity Follow your provider's advice about driving, swimming, and doing other things that would be dangerous if you had a seizure. Wait until your provider says it's safe for you to do these things. If you live in the [place] ask your local department of motor vehicles [facility]) when you can drive. Get enough rest and sleep. Not getting enough sleep can make seizures more likely to happen. Teaching others  Teach friends and family what to do if you have a seizure. Tell them to: Help you get down to the ground safely. Protect your head and body. Loosen any clothing around your neck. Turn you on your side. This  helps keep your airway clear if you vomit. Know whether or not you need emergency care. Stay with you until you are better. Also, tell them what not to do if you have a seizure. Tell them: They should not hold you down. They should not put anything in your mouth. General instructions Avoid anything that has caused you to have seizures. Keep a seizure diary. Write down: What you remember about each seizure. What you think might have caused each seizure. Keep all follow-up visits. Your provider may need to monitor your progress. Contact a health care provider if: You have another seizure or seizures. Call each time you have a seizure. You have a change in how often or when you have seizures. You keep having seizures with treatment. You have symptoms of being sick or having an infection. You are not able to take your medicine. Get help right away if: You have or someone has seen you have: A seizure that lasts longer than 5 minutes. Many seizures in a row and you don't feel better between seizures. A seizure that makes it harder to breathe. A seizure that leaves you unable to speak or use a part of your body. You didn't wake up right away after a seizure. You injure yourself during a seizure. You have confusion or pain right after a seizure. These symptoms may be an emergency. Call 911 right away. Do not wait to see if the symptoms will go away. Do not drive yourself to the hospital.  This information is not intended to replace advice given to you by your health care provider. Make sure you discuss any questions you have with your health care provider. Document Revised: 06/19/2023 Document Reviewed: 10/30/2022 Elsevier Patient Education  2024 [employer]     Maryagnes Small, MD [facility]

## 2024-02-10 NOTE — Assessment & Plan Note (Signed)
 Clinically stable.  New onset seizures. Follows up with neurologist on a regular basis Presently on Keppra  500 mg twice a day and Lamictal  titrating doses up over the next several weeks. Seizure precautions given ED precautions given Diet and nutrition discussed

## 2024-02-10 NOTE — Assessment & Plan Note (Signed)
 Hemorrhoid management discussed Recommend Anusol-HC cream as needed. Stable. No complications.

## 2024-02-10 NOTE — Patient Instructions (Signed)
 Seizure, Adult A seizure is a sudden burst of abnormal activity in the brain. Seizures usually last from 30 seconds to 2 minutes. There are many types of seizures. And they can cause many different symptoms. What are the causes? Common causes of a seizure include: Fever or infection. Problems that affect the brain. These may include: A brain or head injury. A stroke. A brain tumor. Low levels of blood sugar or salt. Kidney problems or liver problems. Some inherited conditions. These are passed down from parent to child. Problems with a substance, such as: Having a reaction to a drug or a medicine. Stopping the use of a substance all of a sudden. When this causes problems, it's called withdrawal. Disorders that affect how you develop, such as autism spectrum disorder or cerebral palsy. Sometimes, the cause may not be known. Some people who have a seizure never have another one. A person who has repeated seizures over time without a clear cause has a condition called epilepsy. What increases the risk? Having a family history of epilepsy. Having had a tonic-clonic seizure before. This type of seizure causes: The muscles of the whole body to tighten, or contract. Loss of consciousness. Having a head injury or a stroke in the past. Having had too little oxygen at birth. What are the signs or symptoms? The symptoms vary depending on the type of seizure you have. Symptoms during a seizure Having convulsions. This means shaking with fast, jerky movements of muscles. Stiffness of the body. Breathing problems. Being confused. Staring or not responding to sound or touch. Head nodding, eye blinking, eye twitching, or fast eye movements. Drooling, grunting, or making clicking sounds with your mouth. Losing control of when you pee or poop. Symptoms before a seizure Feeling afraid, worried, or nervous. Feeling like you may vomit. Vertigo. This feels like: You are moving when you're  not. Things around you are moving when they're not. Dj vu. This is a feeling of having seen or heard something before. Odd tastes or smells. Changes in how you see. You may see flashing lights or spots. Symptoms after a seizure Being confused. Feeling sleepy. Headache. Sore muscles. How is this diagnosed? A seizure may be diagnosed based on: A description of your symptoms. Video of your seizures can be helpful. Your medical history. A physical exam. Tests, such as: Blood tests. CT scan. MRI. Electroencephalogram, or EEG. This test measures electrical activity in the brain. A test of your spinal fluid. This is called a spinal tap or lumbar puncture. How is this treated? If your seizure stops on its own, you will not need treatment. If your seizure lasts longer than 5 minutes, you'll normally need treatment. This may include: Medicines given through an IV. Avoiding things, such as medicines, that are known to cause your seizures. Medicines to prevent seizures. These are called antiepileptics. A device to prevent or control seizures. Eating foods that are low in carbohydrates and high in fat (ketogenic diet). Surgery. This is sometimes needed if you keep having seizures. Follow these instructions at home: Medicines Take your medicines only as told by your health care provider. Avoid anything that may keep your medicine from working, such as alcohol. Activity Follow your provider's advice about driving, swimming, and doing other things that would be dangerous if you had a seizure. Wait until your provider says it's safe for you to do these things. If you live in the [place] ask your local department of motor vehicles [facility]) when you can drive. Get  enough rest and sleep. Not getting enough sleep can make seizures more likely to happen. Teaching others  Teach friends and family what to do if you have a seizure. Tell them to: Help you get down to the ground safely. Protect your head  and body. Loosen any clothing around your neck. Turn you on your side. This helps keep your airway clear if you vomit. Know whether or not you need emergency care. Stay with you until you are better. Also, tell them what not to do if you have a seizure. Tell them: They should not hold you down. They should not put anything in your mouth. General instructions Avoid anything that has caused you to have seizures. Keep a seizure diary. Write down: What you remember about each seizure. What you think might have caused each seizure. Keep all follow-up visits. Your provider may need to monitor your progress. Contact a health care provider if: You have another seizure or seizures. Call each time you have a seizure. You have a change in how often or when you have seizures. You keep having seizures with treatment. You have symptoms of being sick or having an infection. You are not able to take your medicine. Get help right away if: You have or someone has seen you have: A seizure that lasts longer than 5 minutes. Many seizures in a row and you don't feel better between seizures. A seizure that makes it harder to breathe. A seizure that leaves you unable to speak or use a part of your body. You didn't wake up right away after a seizure. You injure yourself during a seizure. You have confusion or pain right after a seizure. These symptoms may be an emergency. Call 911 right away. Do not wait to see if the symptoms will go away. Do not drive yourself to the hospital. This information is not intended to replace advice given to you by your health care provider. Make sure you discuss any questions you have with your health care provider. Document Revised: 06/19/2023 Document Reviewed: 10/30/2022 Elsevier Patient Education  2024 [employer].

## 2024-02-27 ENCOUNTER — Ambulatory Visit: Admitting: Neurology

## 2024-02-27 ENCOUNTER — Encounter: Payer: Self-pay | Admitting: Neurology

## 2024-02-27 VITALS — BP 121/83 | HR 87 | Ht 75.0 in | Wt 211.0 lb

## 2024-02-27 DIAGNOSIS — G40909 Epilepsy, unspecified, not intractable, without status epilepticus: Secondary | ICD-10-CM

## 2024-02-27 MED ORDER — CLOBAZAM 10 MG PO TABS: 10.0000 mg | ORAL_TABLET | Freq: Every evening | ORAL | 5 refills | Status: DC

## 2024-02-27 NOTE — Progress Notes (Signed)
 [facility]  PATIENT: Timothy Lynch DOB: 11-20-1995  REQUESTING CLINICIAN: No ref. provider found HISTORY FROM: Patient/Chart review  REASON FOR VISIT: Epilepsy, Follow up    HISTORICAL  CHIEF COMPLAINT:  Chief Complaint  Patient presents with   Follow-up    RM 12, alone. Last seen 12/04/23. Seizure f/u. Started lamotrigine  about 2.5 wk ago. Has rash on right forearm that started after taking med. Currently taking 25mg  po BID d/t rash development. Worried about taking higher dose, wants to discuss.  No recent seizures. Feeling like he could have a seizure sometimes. Having hand tremors intermittently.   Also feeling dizzy about 5-10 min after taking seizure medications.   INTERVAL HISTORY 02/27/2024 Patient presents today for follow-up, last visit was in March, at that time we had planned to switch him to lamotrigine  due to side effect from the Keppra .  He tells me he started lamotrigine  but developed a rash on the right forearm therefore he decreased the dose.  Currently, he is taking 25 mg twice daily.  Denies any worsening of the rash.  He denies any seizure or seizure-like activity but was told by father that he did have an event in the middle of the night, he walked up to his father and asked him to call the radio station.  He does not remember the event.  Not sure if this was associated with seizures or if he was having sleepwalking.   He continued to have increased anxiety, said that sometimes his left arm will start shaking and he will worry that he will go to have seizures.  He has not had any additional seizures.  He does have a PCP now.   HISTORY OF PRESENT ILLNESS:  This is a 28 year old gentleman with no reported past medical history who is presenting to establish care for his new onset seizure disorder.  Patient reports his first seizure occurred in January 2024.  He tells me that he does not remember exactly what happened but he was told that he had a  seizure and was very combative. He was admitted to hospital for few days had full workup, including LP, seizure etiology was not established.  He was discharged home on Keppra .  He tells me since then he had a total of 6 or 7 seizures, the last seizure was in January 2025.  He also reports that he has been not adherent with his Keppra  causing him to have breakthrough seizures.  He tells me since his last seizure in January 2025, he has been more consistent with the Keppra  but it is given him vivid dreams, almost every night and extreme anxiety, but he does report taking it twice a day.  With his seizures, he did have a laceration and tongue biting and urinary incontinence.  He does have a family history of seizure including his paternal cousin and uncle.   Handedness: Left handed   Onset: January 2024  Seizure Type: Generalized convulsion  Current frequency: A total of 7 seizure, last one 10/12/2023  Any injuries from seizures: Fall, injury with laceration, tongue biting, urinary incontinence  Seizure risk factors: Paternal cousin and Paternal uncle   Previous ASMs: Levetiracetam , Lamotrigine    Currenty ASMs: Levetiracetam  500 mg BID   ASMs side effects: Anxiety, Vivid dream   Brain Images: Normal   Previous EEGs: Diffuse slowing, repeat EEG normal    OTHER MEDICAL CONDITIONS: Seizures, Anxiety  REVIEW OF SYSTEMS: Full 14 system review of systems performed and negative with exception  of: As noted in the HPI   ALLERGIES: No Known Allergies  HOME MEDICATIONS: Outpatient Medications Prior to Visit  Medication Sig Dispense Refill   diazePAM, 20 MG Dose, (VALTOCO 20 MG DOSE) 2 x 10 MG/0.1ML LQPK Spray 1 vial (10 mg) into each nostril at onset on convulsing and call 911 2 each 3   hydrocortisone  (ANUSOL -HC) 2.5 % rectal cream Place 1 Application rectally 2 (two) times daily. 30 g 2   levETIRAcetam  (KEPPRA ) 500 MG tablet Take 1 tablet (500 mg total) by mouth 2 (two) times daily. 60  tablet 1   lamoTRIgine  (LAMICTAL ) 25 MG tablet Take 1 tablet (25 mg total) by mouth every evening for 7 days, THEN 1 tablet (25 mg total) 2 (two) times daily for 7 days, THEN 2 tablets (50 mg total) 2 (two) times daily for 7 days, THEN 3 tablets (75 mg total) 2 (two) times daily for 7 days, THEN 4 tablets (100 mg total) 2 (two) times daily for 14 days. 240 tablet 0   No facility-administered medications prior to visit.    PAST MEDICAL HISTORY: Past Medical History:  Diagnosis Date   Seizures (HCC)     PAST SURGICAL HISTORY: History reviewed. No pertinent surgical history.  FAMILY HISTORY: Family History  Problem Relation Age of Onset   Lupus Mother    Diabetes Father    Heart disease Father    Coronary artery disease Father    Gout Father    Heart attack Father    Transient ischemic attack Father    Coronary artery disease Brother    Seizures Paternal Uncle    Kidney disease Maternal Grandmother    Depression Maternal Grandmother    Lung disease Paternal Grandfather    Heart attack Paternal Grandfather    Seizures Cousin     SOCIAL HISTORY: Social History   Socioeconomic History   Marital status: Single    Spouse name: Not on file   Number of children: 1   Years of education: Not on file   Highest education level: Some college, no degree  Occupational History   Not on file  Tobacco Use   Smoking status: Every Day    Current packs/day: 0.50    Types: Cigarettes   Smokeless tobacco: Never  Vaping Use   Vaping status: Never Used  Substance and Sexual Activity   Alcohol use: Yes    Alcohol/week: 1.0 standard drink of alcohol    Types: 1 Cans of beer per week    Comment: not often   Drug use: Yes    Frequency: 7.0 times per week    Types: Marijuana    Comment: every other day   Sexual activity: Yes    Birth control/protection: Condom  Other Topics Concern   Not on file  Social History Narrative   Not on file   Social Drivers of Health   Financial  Resource Strain: Not on file  Food Insecurity: No Food Insecurity (10/24/2022)   Hunger Vital Sign    Worried About Running Out of Food in the Last Year: Never true    Ran Out of Food in the Last Year: Never true  Transportation Needs: No Transportation Needs (10/24/2022)   PRAPARE - Administrator, Civil Service (Medical): No    Lack of Transportation (Non-Medical): No  Physical Activity: Not on file  Stress: Not on file  Social Connections: Not on file  Intimate Partner Violence: Not At Risk (10/06/2023)   Received from [facility]   HITS    Over the last 12 months how often did your partner physically hurt you?: Never    Over the last 12 months how often did your partner insult you or talk down to you?: Never    Over the last 12 months how often did your partner threaten you with physical harm?: Never    Over the last 12 months how often did your partner scream or curse at you?: Never    PHYSICAL EXAM  GENERAL EXAM/CONSTITUTIONAL: Vitals:  Vitals:   02/27/24 0944  BP: 121/83  Pulse: 87  Weight: 211 lb (95.7 kg)  Height: 6\' 3"  (1.905 m)   Body mass index is 26.37 kg/m. Wt Readings from Last 3 Encounters:  02/27/24 211 lb (95.7 kg)  02/10/24 209 lb (94.8 kg)  12/04/23 207 lb 8 oz (94.1 kg)   Patient is in no distress; well developed, nourished and groomed; neck is supple  MUSCULOSKELETAL: Gait, strength, tone, movements noted in Neurologic exam below  NEUROLOGIC: MENTAL STATUS:      No data to display         awake, alert, oriented to person, place and time recent and remote memory intact normal attention and concentration language fluent, comprehension intact, naming intact fund of knowledge appropriate  CRANIAL NERVE:  2nd, 3rd, 4th, 6th - Visual fields full to confrontation, extraocular muscles intact, no nystagmus 5th - facial sensation symmetric 7th - facial strength symmetric 8th - hearing intact 9th - palate elevates symmetrically, uvula  midline 11th - shoulder shrug symmetric 12th - tongue protrusion midline  MOTOR:  normal bulk and tone, full strength in the BUE, BLE  SENSORY:  normal and symmetric to light touch  COORDINATION:  finger-nose-finger, fine finger movements normal  GAIT/STATION:  normal   DIAGNOSTIC DATA (LABS, IMAGING, TESTING) - I reviewed patient records, labs, notes, testing and imaging myself where available.  Lab Results  Component Value Date   WBC 6.5 11/16/2023   HGB 14.5 11/16/2023   HCT 43.9 11/16/2023   MCV 94.4 11/16/2023   PLT 235 11/16/2023      Component Value Date/Time   NA 139 11/16/2023 1232   K 3.8 11/16/2023 1232   CL 104 11/16/2023 1232   CO2 25 11/16/2023 1232   GLUCOSE 111 (H) 11/16/2023 1232   BUN 9 11/16/2023 1232   CREATININE 0.89 11/16/2023 1232   CALCIUM 9.2 11/16/2023 1232   PROT 6.9 06/10/2023 0640   ALBUMIN 3.8 06/10/2023 0640   AST 23 06/10/2023 0640   ALT 16 06/10/2023 0640   ALKPHOS 57 06/10/2023 0640   BILITOT 1.6 (H) 06/10/2023 0640   GFRNONAA >60 11/16/2023 1232   GFRAA >60 03/22/2019 0438   Lab Results  Component Value Date   TRIG 217 (H) 10/21/2022   Lab Results  Component Value Date   HGBA1C 4.6 11/07/2022   No results found for: "VITAMINB12" No results found for: "TSH"  MRI Brain 10/20/2022 Normal brain MRI. No evidence of acute intracranial abnormality.   EEG 10/20/2022:  Continuous slow, generalized   EEG 12/15/2023:  Normal   I personally reviewed brain Images.   ASSESSMENT AND PLAN  28 y.o. year old male  with no reported past medical history who is presenting with new onset epilepsy.  He is currently on Keppra  500 mg twice daily but does have side effect of vivid dreams and extreme anxiety.  We had tried to switch him to lamotrigine  but he did develop a rash.  Plan  will be to discontinue lamotrigine  and switch past patient to clobazam 10 mg nightly.  Advised him to contact me if he does have any side effect of the  medication or any breakthrough seizure.  I will see him in 2 months for follow-up he is comfortable with plans.   1. Nonintractable epilepsy without status epilepticus, unspecified epilepsy type [facility])      Patient Instructions  Discontinue lamotrigine  due to rash Continue with Keppra  500 mg twice daily Start clobazam 10 mg nightly Please contact me if you do have any side effect from the medication Follow-up in 2 months or sooner if worse.  Per [place] statutes, patients with seizures are not allowed to drive until they have been seizure-free for six months.  Other recommendations include using caution when using heavy equipment or power tools. Avoid working on ladders or at heights. Take showers instead of baths.  Do not swim alone.  Ensure the water  temperature is not too high on the home water  heater. Do not go swimming alone. Do not lock yourself in a room alone (i.e. bathroom). When caring for infants or small children, sit down when holding, feeding, or changing them to minimize risk of injury to the child in the event you have a seizure. Maintain good sleep hygiene. Avoid alcohol.  Also recommend adequate sleep, hydration, good diet and minimize stress.   During the Seizure  - First, ensure adequate ventilation and place patients on the floor on their left side  Loosen clothing around the neck and ensure the airway is patent. If the patient is clenching the teeth, do not force the mouth open with any object as this can cause severe damage - Remove all items from the surrounding that can be hazardous. The patient may be oblivious to what's happening and may not even know what he or she is doing. If the patient is confused and wandering, either gently guide him/her away and block access to outside areas - Reassure the individual and be comforting - Call 911. In most cases, the seizure ends before EMS arrives. However, there are cases when seizures may last over 3 to 5 minutes.  Or the individual may have developed breathing difficulties or severe injuries. If a pregnant patient or a person with diabetes develops a seizure, it is prudent to call an ambulance. - Finally, if the patient does not regain full consciousness, then call EMS. Most patients will remain confused for about 45 to 90 minutes after a seizure, so you must use judgment in calling for help. - Avoid restraints but make sure the patient is in a bed with padded side rails - Place the individual in a lateral position with the neck slightly flexed; this will help the saliva drain from the mouth and prevent the tongue from falling backward - Remove all nearby furniture and other hazards from the area - Provide verbal assurance as the individual is regaining consciousness - Provide the patient with privacy if possible - Call for help and start treatment as ordered by the caregiver   After the Seizure (Postictal Stage)  After a seizure, most patients experience confusion, fatigue, muscle pain and/or a headache. Thus, one should permit the individual to sleep. For the next few days, reassurance is essential. Being calm and helping reorient the person is also of importance.  Most seizures are painless and end spontaneously. Seizures are not harmful to others but can lead to complications such as stress on the lungs, brain and the  heart. Individuals with prior lung problems may develop labored breathing and respiratory distress.    Discussed Patients with epilepsy have a small risk of sudden unexpected death, a condition referred to as sudden unexpected death in epilepsy (SUDEP). SUDEP is defined specifically as the sudden, unexpected, witnessed or unwitnessed, nontraumatic and nondrowning death in patients with epilepsy with or without evidence for a seizure, and excluding documented status epilepticus, in which post mortem examination does not reveal a structural or toxicologic cause for death     No orders of the  defined types were placed in this encounter.   Meds ordered this encounter  Medications   cloBAZam  (ONFI ) 10 MG tablet    Sig: Take 1 tablet (10 mg total) by mouth at bedtime.    Dispense:  30 tablet    Refill:  5    Return in about 8 weeks (around 04/23/2024).  The patient's condition requires frequent monitoring and adjustments in the treatment plan, reflecting the ongoing complexity of care.  This provider is the continuing focal point for all needed services for this condition.   Cassandra Cleveland, MD 02/27/2024, 10:14 AM  [facility] [address], [address] [place], [place] [address] [phone]

## 2024-02-27 NOTE — Patient Instructions (Signed)
 Discontinue lamotrigine  due to rash Continue with Keppra  500 mg twice daily Start clobazam 10 mg nightly Please contact me if you do have any side effect from the medication Follow-up in 2 months or sooner if worse.

## 2024-04-23 ENCOUNTER — Other Ambulatory Visit: Payer: Self-pay | Admitting: *Deleted

## 2024-04-23 ENCOUNTER — Ambulatory Visit (INDEPENDENT_AMBULATORY_CARE_PROVIDER_SITE_OTHER): Admitting: Neurology

## 2024-04-23 ENCOUNTER — Encounter: Payer: Self-pay | Admitting: Neurology

## 2024-04-23 VITALS — BP 122/78 | HR 71 | Ht 75.0 in | Wt 214.8 lb

## 2024-04-23 DIAGNOSIS — G40909 Epilepsy, unspecified, not intractable, without status epilepticus: Secondary | ICD-10-CM

## 2024-04-23 DIAGNOSIS — Z5181 Encounter for therapeutic drug level monitoring: Secondary | ICD-10-CM

## 2024-04-23 MED ORDER — CLOBAZAM 10 MG PO TABS: 10.0000 mg | ORAL_TABLET | Freq: Two times a day (BID) | ORAL | 5 refills | Status: AC

## 2024-04-23 NOTE — Progress Notes (Signed)
 [facility]  PATIENT: Timothy Lynch DOB: [birth year]  REQUESTING CLINICIAN: Purcell Emil Schanz, * HISTORY FROM: Patient/Chart review  REASON FOR VISIT: Epilepsy, Follow up    HISTORICAL  CHIEF COMPLAINT:  Chief Complaint  Patient presents with   Follow-up    Pt in room 12. Alone. Here for seizure follow up. Pt reports no seizure activity. Pt states his family told he blacks out and he feel it may be small seizures, happens about every other week. Pt reports he doesn't remember.     INTERVAL HISTORY 04/23/2024 Patient presents today for follow-up, last visit was in May, at that time we started him on clobazam .  He did have a rash with the lamotrigine .  He tells me that his rash improved since stopping the lamotrigine .  He has not had an additional seizure but still complains of increased anxiety.  Tells me that he is homeless, he suffers from hemorrhoids, his disability is still pending, no income for him.  This all adds to his anxiety.  But again no additional generalized seizures but was told that he has episodes of blacking out, denies any injuries.   INTERVAL HISTORY 02/27/2024 Patient presents today for follow-up, last visit was in March, at that time we had planned to switch him to lamotrigine  due to side effect from the Keppra .  He tells me he started lamotrigine  but developed a rash on the right forearm therefore he decreased the dose.  Currently, he is taking 25 mg twice daily.  Denies any worsening of the rash.  He denies any seizure or seizure-like activity but was told by father that he did have an event in the middle of the night, he walked up to his father and asked him to call the radio station.  He does not remember the event.  Not sure if this was associated with seizures or if he was having sleepwalking.   He continued to have increased anxiety, said that sometimes his left arm will start shaking and he will worry that he will go to have seizures.  He  has not had any additional seizures.  He does have a PCP now.   HISTORY OF PRESENT ILLNESS:  This is a 28 year old gentleman with no reported past medical history who is presenting to establish care for his new onset seizure disorder.  Patient reports his first seizure occurred in January 2024.  He tells me that he does not remember exactly what happened but he was told that he had a seizure and was very combative. He was admitted to hospital for few days had full workup, including LP, seizure etiology was not established.  He was discharged home on Keppra .  He tells me since then he had a total of 6 or 7 seizures, the last seizure was in January 2025.  He also reports that he has been not adherent with his Keppra  causing him to have breakthrough seizures.  He tells me since his last seizure in January 2025, he has been more consistent with the Keppra  but it is given him vivid dreams, almost every night and extreme anxiety, but he does report taking it twice a day.  With his seizures, he did have a laceration and tongue biting and urinary incontinence.  He does have a family history of seizure including his paternal cousin and uncle.   Handedness: Left handed   Onset: January 2024  Seizure Type: Generalized convulsion  Current frequency: A total of 7 seizure, last one 10/12/2023  Any injuries from seizures: Fall, injury with laceration, tongue biting, urinary incontinence  Seizure risk factors: Paternal cousin and Paternal uncle   Previous ASMs: Levetiracetam , Lamotrigine    Currenty ASMs: Levetiracetam  500 mg BID, Clobazam  10 mg nightly    ASMs side effects: Anxiety, Vivid dream   Brain Images: Normal   Previous EEGs: Diffuse slowing, repeat EEG normal    OTHER MEDICAL CONDITIONS: Seizures, Anxiety  REVIEW OF SYSTEMS: Full 14 system review of systems performed and negative with exception of: As noted in the HPI   ALLERGIES: No Known Allergies  HOME MEDICATIONS: Outpatient  Medications Prior to Visit  Medication Sig Dispense Refill   diazePAM , 20 MG Dose, (VALTOCO  20 MG DOSE) 2 x 10 MG/0.1ML LQPK Spray 1 vial (10 mg) into each nostril at onset on convulsing and call 911 2 each 3   hydrocortisone  (ANUSOL -HC) 2.5 % rectal cream Place 1 Application rectally 2 (two) times daily. 30 g 2   cloBAZam  (ONFI ) 10 MG tablet Take 1 tablet (10 mg total) by mouth at bedtime. 30 tablet 5   levETIRAcetam  (KEPPRA ) 500 MG tablet Take 1 tablet (500 mg total) by mouth 2 (two) times daily. 60 tablet 1   No facility-administered medications prior to visit.    PAST MEDICAL HISTORY: Past Medical History:  Diagnosis Date   Seizures (HCC)     PAST SURGICAL HISTORY: History reviewed. No pertinent surgical history.  FAMILY HISTORY: Family History  Problem Relation Age of Onset   Lupus Mother    Diabetes Father    Heart disease Father    Coronary artery disease Father    Gout Father    Heart attack Father    Transient ischemic attack Father    Coronary artery disease Brother    Seizures Paternal Uncle    Kidney disease Maternal Grandmother    Depression Maternal Grandmother    Lung disease Paternal Grandfather    Heart attack Paternal Grandfather    Seizures Cousin     SOCIAL HISTORY: Social History   Socioeconomic History   Marital status: Single    Spouse name: Not on file   Number of children: 1   Years of education: Not on file   Highest education level: Some college, no degree  Occupational History   Not on file  Tobacco Use   Smoking status: Some Days    Types: Cigars   Smokeless tobacco: Never  Vaping Use   Vaping status: Never Used  Substance and Sexual Activity   Alcohol use: Yes    Alcohol/week: 1.0 standard drink of alcohol    Types: 1 Cans of beer per week    Comment: not often   Drug use: Yes    Frequency: 7.0 times per week    Types: Marijuana    Comment: every other day   Sexual activity: Yes    Birth control/protection: Condom  Other  Topics Concern   Not on file  Social History Narrative   Not on file   Social Drivers of Health   Financial Resource Strain: Not on file  Food Insecurity: No Food Insecurity (10/24/2022)   Hunger Vital Sign    Worried About Running Out of Food in the Last Year: Never true    Ran Out of Food in the Last Year: Never true  Transportation Needs: No Transportation Needs (10/24/2022)   PRAPARE - Administrator, Civil Service (Medical): No    Lack of Transportation (Non-Medical): No  Physical Activity: Not on file  Stress: Not on file  Social Connections: Not on file  Intimate Partner Violence: Not At Risk (10/06/2023)   Received from [facility]   HITS    Over the last 12 months how often did your partner physically hurt you?: Never    Over the last 12 months how often did your partner insult you or talk down to you?: Never    Over the last 12 months how often did your partner threaten you with physical harm?: Never    Over the last 12 months how often did your partner scream or curse at you?: Never    PHYSICAL EXAM  GENERAL EXAM/CONSTITUTIONAL: Vitals:  Vitals:   04/23/24 0801  BP: 122/78  Pulse: 71  Weight: 214 lb 12.8 oz (97.4 kg)  Height: 6' 3 (1.905 m)    Body mass index is 26.85 kg/m. Wt Readings from Last 3 Encounters:  04/23/24 214 lb 12.8 oz (97.4 kg)  02/27/24 211 lb (95.7 kg)  02/10/24 209 lb (94.8 kg)   Patient is in no distress; well developed, nourished and groomed; neck is supple  MUSCULOSKELETAL: Gait, strength, tone, movements noted in Neurologic exam below  NEUROLOGIC: MENTAL STATUS:      No data to display         awake, alert, oriented to person, place and time recent and remote memory intact normal attention and concentration language fluent, comprehension intact, naming intact fund of knowledge appropriate  CRANIAL NERVE:  2nd, 3rd, 4th, 6th - Visual fields full to confrontation, extraocular muscles intact, no nystagmus 5th  - facial sensation symmetric 7th - facial strength symmetric 8th - hearing intact 9th - palate elevates symmetrically, uvula midline 11th - shoulder shrug symmetric 12th - tongue protrusion midline  MOTOR:  normal bulk and tone, full strength in the BUE, BLE  SENSORY:  normal and symmetric to light touch  COORDINATION:  finger-nose-finger, fine finger movements normal  GAIT/STATION:  normal   DIAGNOSTIC DATA (LABS, IMAGING, TESTING) - I reviewed patient records, labs, notes, testing and imaging myself where available.  Lab Results  Component Value Date   WBC 6.5 11/16/2023   HGB 14.5 11/16/2023   HCT 43.9 11/16/2023   MCV 94.4 11/16/2023   PLT 235 11/16/2023      Component Value Date/Time   NA 139 11/16/2023 1232   K 3.8 11/16/2023 1232   CL 104 11/16/2023 1232   CO2 25 11/16/2023 1232   GLUCOSE 111 (H) 11/16/2023 1232   BUN 9 11/16/2023 1232   CREATININE 0.89 11/16/2023 1232   CALCIUM 9.2 11/16/2023 1232   PROT 6.9 06/10/2023 0640   ALBUMIN 3.8 06/10/2023 0640   AST 23 06/10/2023 0640   ALT 16 06/10/2023 0640   ALKPHOS 57 06/10/2023 0640   BILITOT 1.6 (H) 06/10/2023 0640   GFRNONAA >60 11/16/2023 1232   GFRAA >60 03/22/2019 0438   Lab Results  Component Value Date   TRIG 217 (H) 10/21/2022   Lab Results  Component Value Date   HGBA1C 4.6 11/07/2022   No results found for: VITAMINB12 No results found for: TSH  MRI Brain 10/20/2022 Normal brain MRI. No evidence of acute intracranial abnormality.   EEG 10/20/2022:  Continuous slow, generalized   EEG 12/15/2023:  Normal   I personally reviewed brain Images.   ASSESSMENT AND PLAN  28 y.o. year old male  with no reported past medical history who is presenting for follow up for his new onset epilepsy.  He is doing well on Keppra  and  clobazam  but still complains of increased anxiety.  Plan will be to discontinue Keppra  and increase clobazam  to 10 mg twice daily..  Clobazam  can help his anxiety and  control his seizures.  I have advised him to contact me if he does have any side effect of the medication otherwise I will see him in 6 months for follow-up or sooner if worse. He voiced understanding.     1. Nonintractable epilepsy without status epilepticus, unspecified epilepsy type (HCC)   2. Therapeutic drug monitoring      Patient Instructions  Discontinue Keppra   Increase Clobazam  to 10 mg twice daily  Will check Clobazam  level today with CMP Continue your other medications  Continue to follow up with PCP  Return in 6 months or sooner if worse   Per   [place]V statutes, patients with seizures are not allowed to drive until they have been seizure-free for six months.  Other recommendations include using caution when using heavy equipment or power tools. Avoid working on ladders or at heights. Take showers instead of baths.  Do not swim alone.  Ensure the water  temperature is not too high on the home water  heater. Do not go swimming alone. Do not lock yourself in a room alone (i.e. bathroom). When caring for infants or small children, sit down when holding, feeding, or changing them to minimize risk of injury to the child in the event you have a seizure. Maintain good sleep hygiene. Avoid alcohol.  Also recommend adequate sleep, hydration, good diet and minimize stress.   During the Seizure  - First, ensure adequate ventilation and place patients on the floor on their left side  Loosen clothing around the neck and ensure the airway is patent. If the patient is clenching the teeth, do not force the mouth open with any object as this can cause severe damage - Remove all items from the surrounding that can be hazardous. The patient may be oblivious to what's happening and may not even know what he or she is doing. If the patient is confused and wandering, either gently guide him/her away and block access to outside areas - Reassure the individual and be comforting - Call 911. In  most cases, the seizure ends before EMS arrives. However, there are cases when seizures may last over 3 to 5 minutes. Or the individual may have developed breathing difficulties or severe injuries. If a pregnant patient or a person with diabetes develops a seizure, it is prudent to call an ambulance. - Finally, if the patient does not regain full consciousness, then call EMS. Most patients will remain confused for about 45 to 90 minutes after a seizure, so you must use judgment in calling for help. - Avoid restraints but make sure the patient is in a bed with padded side rails - Place the individual in a lateral position with the neck slightly flexed; this will help the saliva drain from the mouth and prevent the tongue from falling backward - Remove all nearby furniture and other hazards from the area - Provide verbal assurance as the individual is regaining consciousness - Provide the patient with privacy if possible - Call for help and start treatment as ordered by the caregiver   After the Seizure (Postictal Stage)  After a seizure, most patients experience confusion, fatigue, muscle pain and/or a headache. Thus, one should permit the individual to sleep. For the next few days, reassurance is essential. Being calm and helping reorient the person is also of importance.  Most seizures are painless and end spontaneously. Seizures are not harmful to others but can lead to complications such as stress on the lungs, brain and the heart. Individuals with prior lung problems may develop labored breathing and respiratory distress.    Discussed Patients with epilepsy have a small risk of sudden unexpected death, a condition referred to as sudden unexpected death in epilepsy (SUDEP). SUDEP is defined specifically as the sudden, unexpected, witnessed or unwitnessed, nontraumatic and nondrowning death in patients with epilepsy with or without evidence for a seizure, and excluding documented status epilepticus,  in which post mortem examination does not reveal a structural or toxicologic cause for death     Orders Placed This Encounter  Procedures   CMP    Meds ordered this encounter  Medications   cloBAZam  (ONFI ) 10 MG tablet    Sig: Take 1 tablet (10 mg total) by mouth 2 (two) times daily.    Dispense:  60 tablet    Refill:  5    Return in about 6 months (around 10/24/2024).  The patient's condition requires frequent monitoring and adjustments in the treatment plan, reflecting the ongoing complexity of care.  This provider is the continuing focal point for all needed services for this condition.   Pastor Falling, MD 04/23/2024, 8:23 AM  [facility] [address] [place], [place] [address] [phone]

## 2024-04-23 NOTE — Patient Instructions (Addendum)
 Discontinue Keppra   Increase Clobazam  to 10 mg twice daily  Will check Clobazam  level today with CMP Continue your other medications  Continue to follow up with PCP  Return in 6 months or sooner if worse

## 2024-04-27 ENCOUNTER — Other Ambulatory Visit: Payer: Self-pay

## 2024-04-27 ENCOUNTER — Encounter: Payer: Self-pay | Admitting: Emergency Medicine

## 2024-04-27 ENCOUNTER — Ambulatory Visit: Payer: Self-pay | Admitting: Neurology

## 2024-04-27 ENCOUNTER — Ambulatory Visit (INDEPENDENT_AMBULATORY_CARE_PROVIDER_SITE_OTHER): Admitting: Emergency Medicine

## 2024-04-27 ENCOUNTER — Telehealth: Payer: Self-pay | Admitting: *Deleted

## 2024-04-27 VITALS — BP 138/90 | HR 75 | Temp 97.5°F | Ht 75.0 in | Wt 214.0 lb

## 2024-04-27 DIAGNOSIS — F4323 Adjustment disorder with mixed anxiety and depressed mood: Secondary | ICD-10-CM

## 2024-04-27 DIAGNOSIS — R569 Unspecified convulsions: Secondary | ICD-10-CM | POA: Diagnosis not present

## 2024-04-27 LAB — COMPREHENSIVE METABOLIC PANEL WITH GFR
ALT: 21 IU/L (ref 0–44)
AST: 27 IU/L (ref 0–40)
Albumin: 4.4 g/dL (ref 4.3–5.2)
Alkaline Phosphatase: 92 IU/L (ref 44–121)
BUN/Creatinine Ratio: 8 — ABNORMAL LOW (ref 9–20)
BUN: 7 mg/dL (ref 6–20)
Bilirubin Total: 0.8 mg/dL (ref 0.0–1.2)
CO2: 19 mmol/L — ABNORMAL LOW (ref 20–29)
Calcium: 8.7 mg/dL (ref 8.7–10.2)
Chloride: 107 mmol/L — ABNORMAL HIGH (ref 96–106)
Creatinine, Ser: 0.85 mg/dL (ref 0.76–1.27)
Globulin, Total: 2.5 g/dL (ref 1.5–4.5)
Glucose: 97 mg/dL (ref 70–99)
Potassium: 4.3 mmol/L (ref 3.5–5.2)
Sodium: 140 mmol/L (ref 134–144)
Total Protein: 6.9 g/dL (ref 6.0–8.5)
eGFR: 120 mL/min/1.73 (ref >59)

## 2024-04-27 LAB — CLOBAZAM
Clobazam: 73 ng/mL (ref 30–300)
Desmethylclobazam: <50 ng/mL — AB (ref 300–3000)

## 2024-04-27 NOTE — Patient Outreach (Signed)
 Complex Care Management   Visit Note  04/27/2024  Name:  Timothy Lynch MRN: [id number] DOB: [birth year]  Situation: Referral received for Complex Care Management related to SDOH Barriers:  Transportation Housing   Food insecurity Financial Resource Strain I obtained verbal consent from Patient.  Visit completed with patient  on the phone  Background:   Past Medical History:  Diagnosis Date   Seizures [facility])     Assessment:  BSW contacted Timothy Lynch following an emergent referral to assess for SDOH barriers. During the call, BSW identified multiple barriers, including housing instability, food insecurity, financial strain, and lack of transportation. Timothy Lynch disclosed that he is currently experiencing homelessness and residing in [place]. He reported that he has been unable to secure employment after experiencing multiple falls at work caused by seizures, which ultimately led to termination of his previous job. Although he continues to apply for work, he expressed concern that his disability is impacting his ability to be hired. Timothy Lynch stated he has no current income and is unable to afford basic necessities. He shared that he is on the waiting list for housing through [employer] but has not yet received any assistance. In addition, he has applied for disability benefits but is still awaiting a determination  BSW provided Timothy Lynch with resources for temporary and long-term housing, food assistance, financial support, and employment services.    SDOH Interventions Today    Flowsheet Row Most Recent Value  SDOH Interventions   Food Insecurity Interventions Community Resources Provided  Housing Interventions Community Resources Provided  Transportation Interventions Community Resources Provided  Financial Strain Interventions Community Resources Provided    Recommendation:   Referral to: RNCM and LCSW  Follow Up Plan:   Patient has met all care  management goals. Care Management case will be closed. Patient has been provided contact information should new needs arise.   Orlean Fey, BSW [place]  [facility] Social Worker, [employer] [facility] [phone]

## 2024-04-27 NOTE — Progress Notes (Signed)
 Timothy Lynch 28 y.o.   Chief Complaint  Patient presents with   Seizures    Patient states his seizure symptoms are getting worse and is in a lot of pain. He has been shaking, memory, losing feeling in left leg hurts to walk. Patient states 2 weeks ago he past out and hit his face. He states he saw the neuro and they gave him new meds, he states he is tired of being in pain and being told the same thing from all his appt, he loss both jobs and does not know what to do     HISTORY OF PRESENT ILLNESS: This is a 28 y.o. male here for follow-up of seizure disorder. States he is homeless and unable to work.  Waiting on disability. Also complaining of chronic pain.  NTERVAL HISTORY 04/23/2024, most recent neurologist office visit Patient presents today for follow-up, last visit was in May, at that time we started him on clobazam .  He did have a rash with the lamotrigine .  He tells me that his rash improved since stopping the lamotrigine .  He has not had an additional seizure but still complains of increased anxiety.  Tells me that he is homeless, he suffers from hemorrhoids, his disability is still pending, no income for him.  This all adds to his anxiety.  But again no additional generalized seizures but was told that he has episodes of blacking out, denies any injuries.  Most recent neurologist office visit assessment and plan as follows: ASSESSMENT AND PLAN   28 y.o. year old male  with no reported past medical history who is presenting for follow up for his new onset epilepsy.  He is doing well on Keppra  and clobazam  but still complains of increased anxiety.  Plan will be to discontinue Keppra  and increase clobazam  to 10 mg twice daily..  Clobazam  can help his anxiety and control his seizures.  I have advised him to contact me if he does have any side effect of the medication otherwise I will see him in 6 months for follow-up or sooner if worse. He voiced understanding.        1.  Nonintractable epilepsy without status epilepticus, unspecified epilepsy type (HCC)   2. Therapeutic drug monitoring         Patient Instructions  Discontinue Keppra   Increase Clobazam  to 10 mg twice daily  Will check Clobazam  level today with CMP Continue your other medications  Continue to follow up with PCP  Return in 6 months or sooner if worse   Seizures  Pertinent negatives include no sore throat, no chest pain, no cough, no nausea, no vomiting and no diarrhea.     Prior to Admission medications   Medication Sig Start Date End Date Taking? Authorizing Provider  cloBAZam  (ONFI ) 10 MG tablet Take 1 tablet (10 mg total) by mouth 2 (two) times daily. 04/23/24  Yes Camara, Pastor, MD  diazePAM , 20 MG Dose, (VALTOCO  20 MG DOSE) 2 x 10 MG/0.1ML LQPK Spray 1 vial (10 mg) into each nostril at onset on convulsing and call 911 01/14/24  Yes Camara, Pastor, MD  hydrocortisone  (ANUSOL -HC) 2.5 % rectal cream Place 1 Application rectally 2 (two) times daily. 02/10/24  Yes Purcell Emil Schanz, MD    No Known Allergies  Patient Active Problem List   Diagnosis Date Noted   Hemorrhoids 02/10/2024   Seizure (HCC) 10/20/2022    Past Medical History:  Diagnosis Date   Seizures (HCC)     No past  surgical history on file.  Social History   Socioeconomic History   Marital status: Single    Spouse name: Not on file   Number of children: 1   Years of education: Not on file   Highest education level: Some college, no degree  Occupational History   Not on file  Tobacco Use   Smoking status: Some Days    Types: Cigars   Smokeless tobacco: Never  Vaping Use   Vaping status: Never Used  Substance and Sexual Activity   Alcohol use: Yes    Alcohol/week: 1.0 standard drink of alcohol    Types: 1 Cans of beer per week    Comment: not often   Drug use: Yes    Frequency: 7.0 times per week    Types: Marijuana    Comment: every other day   Sexual activity: Yes    Birth  control/protection: Condom  Other Topics Concern   Not on file  Social History Narrative   Not on file   Social Drivers of Health   Financial Resource Strain: Not on file  Food Insecurity: No Food Insecurity (10/24/2022)   Hunger Vital Sign    Worried About Running Out of Food in the Last Year: Never true    Ran Out of Food in the Last Year: Never true  Transportation Needs: No Transportation Needs (10/24/2022)   PRAPARE - Administrator, Civil Service (Medical): No    Lack of Transportation (Non-Medical): No  Physical Activity: Not on file  Stress: Not on file  Social Connections: Not on file  Intimate Partner Violence: Not At Risk (10/06/2023)   Received from [facility]   HITS    Over the last 12 months how often did your partner physically hurt you?: Never    Over the last 12 months how often did your partner insult you or talk down to you?: Never    Over the last 12 months how often did your partner threaten you with physical harm?: Never    Over the last 12 months how often did your partner scream or curse at you?: Never    Family History  Problem Relation Age of Onset   Lupus Mother    Diabetes Father    Heart disease Father    Coronary artery disease Father    Gout Father    Heart attack Father    Transient ischemic attack Father    Coronary artery disease Brother    Seizures Paternal Uncle    Kidney disease Maternal Grandmother    Depression Maternal Grandmother    Lung disease Paternal Grandfather    Heart attack Paternal Grandfather    Seizures Cousin      Review of Systems  Constitutional: Negative.  Negative for chills and fever.  HENT: Negative.  Negative for congestion and sore throat.   Respiratory: Negative.  Negative for cough and shortness of breath.   Cardiovascular: Negative.  Negative for chest pain and palpitations.  Gastrointestinal:  Negative for abdominal pain, diarrhea, nausea and vomiting.  Genitourinary: Negative.  Negative  for dysuria and hematuria.  Skin: Negative.  Negative for rash.  Neurological:  Positive for seizures.  All other systems reviewed and are negative.   Vitals:   04/27/24 1020  BP: (!) 138/90  Pulse: 75  Temp: (!) 97.5 F (36.4 C)  SpO2: 97%    Physical Exam Vitals reviewed.  Constitutional:      Appearance: Normal appearance.  HENT:  Head: Normocephalic.  Eyes:     Extraocular Movements: Extraocular movements intact.  Cardiovascular:     Rate and Rhythm: Normal rate.  Pulmonary:     Effort: Pulmonary effort is normal.  Skin:    General: Skin is warm and dry.  Neurological:     Mental Status: He is alert and oriented to person, place, and time.  Psychiatric:        Mood and Affect: Mood normal.        Behavior: Behavior normal.      ASSESSMENT & PLAN: A total of 43 minutes was spent with the patient and counseling/coordination of care regarding preparing for this visit, review of most recent office visit notes, review of multiple chronic medical conditions and their management, review of all medications, review of most recent bloodwork results, review of health maintenance items, education on nutrition, prognosis, documentation, and need for follow up.   Problem List Items Addressed This Visit       Other   Seizure (HCC) - Primary   Clinically stable.  New onset seizures. Follows up with neurologist on a regular basis Taken off Keppra  and Lamictal  Presently on clobazam  10 mg twice a day Seizure precautions given ED precautions given Diet and nutrition discussed      Situational mixed anxiety and depressive disorder   Homeless and unable to work Waiting on disability Also complaining of chronic pain Multiple factors contributing to his mental state Not suicidal at present time Needs mental health evaluation by psychiatrist Referral placed today      Relevant Orders   Ambulatory referral to Psychology   Patient Instructions  Health Maintenance,  Male Adopting a healthy lifestyle and getting preventive care are important in promoting health and wellness. Ask your health care provider about: The right schedule for you to have regular tests and exams. Things you can do on your own to prevent diseases and keep yourself healthy. What should I know about diet, weight, and exercise? Eat a healthy diet  Eat a diet that includes plenty of vegetables, fruits, low-fat dairy products, and lean protein. Do not eat a lot of foods that are high in solid fats, added sugars, or sodium. Maintain a healthy weight Body mass index (BMI) is a measurement that can be used to identify possible weight problems. It estimates body fat based on height and weight. Your health care provider can help determine your BMI and help you achieve or maintain a healthy weight. Get regular exercise Get regular exercise. This is one of the most important things you can do for your health. Most adults should: Exercise for at least 150 minutes each week. The exercise should increase your heart rate and make you sweat (moderate-intensity exercise). Do strengthening exercises at least twice a week. This is in addition to the moderate-intensity exercise. Spend less time sitting. Even light physical activity can be beneficial. Watch cholesterol and blood lipids Have your blood tested for lipids and cholesterol at 30 years of age, then have this test every 5 years. You may need to have your cholesterol levels checked more often if: Your lipid or cholesterol levels are high. You are older than 30 years of age. You are at high risk for heart disease. What should I know about cancer screening? Many types of cancers can be detected early and may often be prevented. Depending on your health history and family history, you may need to have cancer screening at various ages. This may include screening for: Colorectal cancer.  Prostate cancer. Skin cancer. Lung cancer. What should I  know about heart disease, diabetes, and high blood pressure? Blood pressure and heart disease High blood pressure causes heart disease and increases the risk of stroke. This is more likely to develop in people who have high blood pressure readings or are overweight. Talk with your health care provider about your target blood pressure readings. Have your blood pressure checked: Every 3-5 years if you are 2-22 years of age. Every year if you are 23 years old or older. If you are between the ages of 32 and 31 and are a current or former smoker, ask your health care provider if you should have a one-time screening for abdominal aortic aneurysm (AAA). Diabetes Have regular diabetes screenings. This checks your fasting blood sugar level. Have the screening done: Once every three years after age 29 if you are at a normal weight and have a low risk for diabetes. More often and at a younger age if you are overweight or have a high risk for diabetes. What should I know about preventing infection? Hepatitis B If you have a higher risk for hepatitis B, you should be screened for this virus. Talk with your health care provider to find out if you are at risk for hepatitis B infection. Hepatitis C Blood testing is recommended for: Everyone born from [birth year] through 1967. Anyone with known risk factors for hepatitis C. Sexually transmitted infections (STIs) You should be screened each year for STIs, including gonorrhea and chlamydia, if: You are sexually active and are younger than 30 years of age. You are older than 30 years of age and your health care provider tells you that you are at risk for this type of infection. Your sexual activity has changed since you were last screened, and you are at increased risk for chlamydia or gonorrhea. Ask your health care provider if you are at risk. Ask your health care provider about whether you are at high risk for HIV. Your health care provider may recommend a  prescription medicine to help prevent HIV infection. If you choose to take medicine to prevent HIV, you should first get tested for HIV. You should then be tested every 3 months for as long as you are taking the medicine. Follow these instructions at home: Alcohol use Do not drink alcohol if your health care provider tells you not to drink. If you drink alcohol: Limit how much you have to 0-2 drinks a day. Know how much alcohol is in your drink. In the [place] one drink equals one 12 oz bottle of beer (355 mL), one 5 oz glass of wine (148 mL), or one 1 oz glass of hard liquor (44 mL). Lifestyle Do not use any products that contain nicotine or tobacco. These products include cigarettes, chewing tobacco, and vaping devices, such as e-cigarettes. If you need help quitting, ask your health care provider. Do not use street drugs. Do not share needles. Ask your health care provider for help if you need support or information about quitting drugs. General instructions Schedule regular health, dental, and eye exams. Stay current with your vaccines. Tell your health care provider if: You often feel depressed. You have ever been abused or do not feel safe at home. Summary Adopting a healthy lifestyle and getting preventive care are important in promoting health and wellness. Follow your health care provider's instructions about healthy diet, exercising, and getting tested or screened for diseases. Follow your health care provider's instructions on monitoring your  cholesterol and blood pressure. This information is not intended to replace advice given to you by your health care provider. Make sure you discuss any questions you have with your health care provider. Document Revised: 02/05/2021 Document Reviewed: 02/05/2021 Elsevier Patient Education  2024 Elsevier Inc.     Emil Schaumann, MD [facility]

## 2024-04-27 NOTE — Patient Instructions (Signed)
 Health Maintenance, Male  Adopting a healthy lifestyle and getting preventive care are important in promoting health and wellness. Ask your health care provider about:  The right schedule for you to have regular tests and exams.  Things you can do on your own to prevent diseases and keep yourself healthy.  What should I know about diet, weight, and exercise?  Eat a healthy diet    Eat a diet that includes plenty of vegetables, fruits, low-fat dairy products, and lean protein.  Do not eat a lot of foods that are high in solid fats, added sugars, or sodium.  Maintain a healthy weight  Body mass index (BMI) is a measurement that can be used to identify possible weight problems. It estimates body fat based on height and weight. Your health care provider can help determine your BMI and help you achieve or maintain a healthy weight.  Get regular exercise  Get regular exercise. This is one of the most important things you can do for your health. Most adults should:  Exercise for at least 150 minutes each week. The exercise should increase your heart rate and make you sweat (moderate-intensity exercise).  Do strengthening exercises at least twice a week. This is in addition to the moderate-intensity exercise.  Spend less time sitting. Even light physical activity can be beneficial.  Watch cholesterol and blood lipids  Have your blood tested for lipids and cholesterol at 30 years of age, then have this test every 5 years.  You may need to have your cholesterol levels checked more often if:  Your lipid or cholesterol levels are high.  You are older than 30 years of age.  You are at high risk for heart disease.  What should I know about cancer screening?  Many types of cancers can be detected early and may often be prevented. Depending on your health history and family history, you may need to have cancer screening at various ages. This may include screening for:  Colorectal cancer.  Prostate cancer.  Skin cancer.  Lung  cancer.  What should I know about heart disease, diabetes, and high blood pressure?  Blood pressure and heart disease  High blood pressure causes heart disease and increases the risk of stroke. This is more likely to develop in people who have high blood pressure readings or are overweight.  Talk with your health care provider about your target blood pressure readings.  Have your blood pressure checked:  Every 3-5 years if you are 9-95 years of age.  Every year if you are 85 years old or older.  If you are between the ages of 29 and 29 and are a current or former smoker, ask your health care provider if you should have a one-time screening for abdominal aortic aneurysm (AAA).  Diabetes  Have regular diabetes screenings. This checks your fasting blood sugar level. Have the screening done:  Once every three years after age 23 if you are at a normal weight and have a low risk for diabetes.  More often and at a younger age if you are overweight or have a high risk for diabetes.  What should I know about preventing infection?  Hepatitis B  If you have a higher risk for hepatitis B, you should be screened for this virus. Talk with your health care provider to find out if you are at risk for hepatitis B infection.  Hepatitis C  Blood testing is recommended for:  Everyone born from [birth year] through 1967.  Anyone  with known risk factors for hepatitis C.  Sexually transmitted infections (STIs)  You should be screened each year for STIs, including gonorrhea and chlamydia, if:  You are sexually active and are younger than 30 years of age.  You are older than 30 years of age and your health care provider tells you that you are at risk for this type of infection.  Your sexual activity has changed since you were last screened, and you are at increased risk for chlamydia or gonorrhea. Ask your health care provider if you are at risk.  Ask your health care provider about whether you are at high risk for HIV. Your health care provider  may recommend a prescription medicine to help prevent HIV infection. If you choose to take medicine to prevent HIV, you should first get tested for HIV. You should then be tested every 3 months for as long as you are taking the medicine.  Follow these instructions at home:  Alcohol use  Do not drink alcohol if your health care provider tells you not to drink.  If you drink alcohol:  Limit how much you have to 0-2 drinks a day.  Know how much alcohol is in your drink. In the [place] one drink equals one 12 oz bottle of beer (355 mL), one 5 oz glass of wine (148 mL), or one 1 oz glass of hard liquor (44 mL).  Lifestyle  Do not use any products that contain nicotine or tobacco. These products include cigarettes, chewing tobacco, and vaping devices, such as e-cigarettes. If you need help quitting, ask your health care provider.  Do not use street drugs.  Do not share needles.  Ask your health care provider for help if you need support or information about quitting drugs.  General instructions  Schedule regular health, dental, and eye exams.  Stay current with your vaccines.  Tell your health care provider if:  You often feel depressed.  You have ever been abused or do not feel safe at home.  Summary  Adopting a healthy lifestyle and getting preventive care are important in promoting health and wellness.  Follow your health care provider's instructions about healthy diet, exercising, and getting tested or screened for diseases.  Follow your health care provider's instructions on monitoring your cholesterol and blood pressure.  This information is not intended to replace advice given to you by your health care provider. Make sure you discuss any questions you have with your health care provider.  Document Revised: 02/05/2021 Document Reviewed: 02/05/2021  Elsevier Patient Education  2024 [employer].

## 2024-04-27 NOTE — Progress Notes (Signed)
 Complex Care Management Note  Care Guide Note 04/27/2024 Name: SHONTA PHILLIS MRN: [id number] DOB: 1996/09/09  Lani LITTIE Lunger is a 28 y.o. year old male who sees Sagardia, Emil Schanz, MD for primary care. I reached out to Lani LITTIE Lunger by phone today to offer complex care management services.  Mr. Mccard was given information about Complex Care Management services today including:   The Complex Care Management services include support from the care team which includes your Nurse Care Manager, Clinical Social Worker, or Pharmacist.  The Complex Care Management team is here to help remove barriers to the health concerns and goals most important to you. Complex Care Management services are voluntary, and the patient may decline or stop services at any time by request to their care team member.   Complex Care Management Consent Status: Patient agreed to services and verbal consent obtained.   Follow up plan:  Telephone appointment with complex care management team member scheduled for:  04/27/24  Encounter Outcome:  Patient Scheduled  Harlene Satterfield  [facility]  [facility], [facility] Guide  Direct Dial: [phone]  Fax [phone]

## 2024-04-27 NOTE — Progress Notes (Signed)
 Complex Care Management Note Care Guide Note  04/27/2024 Name: Timothy Lynch MRN: [id number] DOB: October 30, 1995   Complex Care Management Outreach Attempts: An unsuccessful telephone outreach was attempted today to offer the patient information about available complex care management services.  Follow Up Plan:  Additional outreach attempts will be made to offer the patient complex care management information and services.   Encounter Outcome:  No Answer  Harlene Satterfield  [facility]  [facility] Guide  Direct Dial: [phone]  Fax [phone]

## 2024-04-27 NOTE — Assessment & Plan Note (Signed)
 Homeless and unable to work Waiting on disability Also complaining of chronic pain Multiple factors contributing to his mental state Not suicidal at present time Needs mental health evaluation by psychiatrist Referral placed today

## 2024-04-27 NOTE — Assessment & Plan Note (Signed)
 Clinically stable.  New onset seizures. Follows up with neurologist on a regular basis Taken off Keppra  and Lamictal  Presently on clobazam  10 mg twice a day Seizure precautions given ED precautions given Diet and nutrition discussed

## 2024-04-27 NOTE — Patient Instructions (Signed)
 Visit Information  Thank you for taking time to visit with me today. Please don't hesitate to contact me if I can be of assistance to you before our next scheduled appointment.  Our next appointment is no further scheduled appointments.   Please call the care guide team at [phone] if you need to cancel or reschedule your appointment.    Please call the Suicide and Crisis Lifeline: 988 call the [place]  National Suicide Prevention Lifeline: 6020706364 or TTY: 623-840-8122 TTY 641-082-2735) to talk to a trained counselor call 1-800-273-TALK (toll free, 24 hour hotline) go to [facility] [address] [place] [id number]) call 911 if you are experiencing a Mental Health or Behavioral Health Crisis or need someone to talk to.  Patient verbalizes understanding of instructions and care plan provided today and agrees to view in MyChart. Active MyChart status and patient understanding of how to access instructions and care plan via MyChart confirmed with patient.     Haven Lion, BSW [place]  [facility] Social Worker, [employer] [facility] [phone]

## 2024-04-30 ENCOUNTER — Other Ambulatory Visit: Payer: Self-pay

## 2024-04-30 NOTE — Patient Outreach (Signed)
 Complex Care Management   Visit Note  04/30/2024  Name:  Timothy Lynch MRN: [id number] DOB: December 17, 1995  Situation: Referral received for Complex Care Management related to Mental/Behavioral Health diagnosis anxiety and depression I obtained verbal consent from Patient.  Visit completed with patient  on the phone. LCSW met with patient for scheduled visit and completed assessment. Patient states wanting treatment for his anxiety and depression and was given a mental health provider to try to follow up with soon. Patient has an appointment with RN on 05/05/2024.  Background:   Past Medical History:  Diagnosis Date   Seizures (HCC)     Assessment: Patient Reported Symptoms:  Cognitive Cognitive Status: Normal speech and language skills, Alert and oriented to person, place, and time Cognitive/Intellectual Conditions Management [RPT]: None reported or documented in medical history or problem list      Neurological Neurological Review of Symptoms: Dizziness, Other: (patient is seeing a neurologist) Oher Neurological Symptoms/Conditions [RPT]: Patient reports shaking, patient has seizures Neurological Management Strategies: Adequate rest  HEENT HEENT Symptoms Reported: No symptoms reported      Cardiovascular Cardiovascular Symptoms Reported: Other: (Patient reports fast heart beat, patient reports PCP knowing) Does patient have uncontrolled Hypertension?: No    Respiratory Respiratory Symptoms Reported: No symptoms reported    Endocrine Endocrine Symptoms Reported: No symptoms reported Is patient diabetic?: No    Gastrointestinal Gastrointestinal Symptoms Reported: Constipation, Cramping Additional Gastrointestinal Details: Patient reports that PCP is aware of the cramping Gastrointestinal Management Strategies: Adequate rest    Genitourinary Genitourinary Symptoms Reported: No symptoms reported    Integumentary Integumentary Symptoms Reported: Bruising Additional Integumentary  Details: Patient reports bruising when he has a fall Skin Management Strategies: Adequate rest  Musculoskeletal Musculoskelatal Symptoms Reviewed: Unsteady gait, Weakness, Muscle pain Additional Musculoskeletal Details: Patient reports black out and falls when he has a seizures, patient reports pain from his falls Musculoskeletal Management Strategies: Adequate rest Falls in the past year?: Yes Number of falls in past year: 2 or more Was there an injury with Fall?: Yes Fall Risk Category Calculator: 3 Patient Fall Risk Level: High Fall Risk Patient at Risk for Falls Due to: History of fall(s) (Patient has seizures which causes his falls) Fall risk Follow up: Falls evaluation completed  Psychosocial Psychosocial Symptoms Reported: Anxiety - if selected complete GAD, Depression - if selected complete PHQ 2-9 Behavioral Management Strategies: Adequate rest, Coping strategies (Patient reports positive self talk) Major Change/Loss/Stressor/Fears (CP): Environment, Resources, School or job, Medical condition, self (Patient reports that his seizures is affecting his mental status) Techniques to [employer] with Loss/Stress/Change:  (Patient is agreeable to counseling) Quality of Family Relationships: supportive (Patient feels like he cant't depend on his family) Do you feel physically threatened by others?: No      04/30/2024    2:16 PM  Depression screen PHQ 2/9  Decreased Interest 3  Down, Depressed, Hopeless 3  PHQ - 2 Score 6  Altered sleeping 3  Tired, decreased energy 3  Change in appetite 0  Feeling bad or failure about yourself  2  Trouble concentrating 3  Moving slowly or fidgety/restless 0  Suicidal thoughts 0  PHQ-9 Score 17  Difficult doing work/chores Extremely dIfficult    There were no vitals filed for this visit.  Medications Reviewed Today     Reviewed by Sherren Olam FORBES KEN (Social Worker) on 04/30/24 at 1358  Med List Status: <None>   Medication Order Taking? Sig  Documenting Provider Last Dose Status Informant  cloBAZam  (  ONFI ) 10 MG tablet [id number] Yes Take 1 tablet (10 mg total) by mouth 2 (two) times daily. Camara, Amadou, MD  Active   diazePAM , 20 MG Dose, (VALTOCO  20 MG DOSE) 2 x 10 MG/0.1ML LQPK [id number] Yes Spray 1 vial (10 mg) into each nostril at onset on convulsing and call 911 Gregg Lek, MD  Active            Med Note VERTIS, LALITA   Tue Feb 10, 2024 11:07 AM) WHEN NEEDED   hydrocortisone  (ANUSOL -HC) 2.5 % rectal cream [id number] Yes Place 1 Application rectally 2 (two) times daily. Purcell Emil Schanz, MD  Active             Recommendation:   Continue Current Plan of Care  Follow Up Plan:   Telephone follow-up 05/10/2024 at 1:00 PM  Olam Ally, MSW, LCSW [place]  [facility], [facility] Licensed Clinical Social Worker Direct Dial: [phone]

## 2024-04-30 NOTE — Patient Instructions (Signed)
 Visit Information  Timothy Lynch was given information about Medicaid Managed Care team care coordination services as a part of their [facility] Medicaid benefit. [place]   If you would like to schedule transportation through your [facility] plan, please call the following number at least 2 days in advance of your appointment: [phone]  For information about your ride after you set it up, call Ride Assist at [phone]. Use this number to activate a Will Call pickup, or if your transportation is late for a scheduled pickup. Use this number, too, if you need to make a change or cancel a previously scheduled reservation.  If you need transportation services right away, call [phone]. The after-hours call center is staffed 24 hours to handle ride assistance and urgent reservation requests (including discharges) 365 days a year. Urgent trips include sick visits, hospital discharge requests and life-sustaining treatment.  Call the [facility] Line at [phone], at any time, 24 hours a day, 7 days a week. If you are in danger or need immediate medical attention call 911.   Timothy Lynch - following are the goals we discussed in your visit today:   Goals Addressed             This Visit's Progress    VBCI Social Work Care Plan       Problems:   Disease Management support and education needs related to Anxiety with Excessive Worry, and Depression: depressed mood  CSW Clinical Goal(s):   Over the next 90 days the Patient will work with Child psychotherapist to address concerns related to anxiety and depression.  Interventions:  Mental Health:  Evaluation of current treatment plan related to Anxiety with Excessive Worry, and Depression: depressed mood Active listening / Reflection utilized Behavioral Activation reviewed PHQ2/PHQ9 completed Depression screen reviewed GAD 7 completed and reviewed Discussed referral for psychiatry: Patient is agreeable and is  open to medication Discussed referral options to connect for ongoing therapy: LCSW provided patient with [facility]. LCSW discussed their open hours assessments. Patient stated that he would try to go either Monday or Tuesday.  Emotional Support Provided Motivational Interviewing employed Suicidal Ideation/Homicidal Ideation assessed:No plan or intent LCSW discuss positive self talk that patient reports utilizing LCSW encourage patient to think about coping skills that can be utlized Patient provided with the 988 crisis line.  Patient Goals/Self-Care Activities:  Connect with provider for ongoing mental health treatment.    Plan:   Telephone follow up appointment with care management team member scheduled for:  05/10/2024 at 1:00 PM         Patient verbalizes understanding of instructions and care plan provided today and agrees to view in MyChart. Active MyChart status and patient understanding of how to access instructions and care plan via MyChart confirmed with patient.     Licensed Clinical Social Worker will follow up on 05/10/2024 at 1:00 PM  Olam Ally, MSW, LCSW [place]  [facility], Population Health Licensed Clinical Social Worker Direct Dial: [phone]   Following is a copy of your plan of care:  There are no care plans that you recently modified to display for this patient.

## 2024-05-05 ENCOUNTER — Telehealth: Payer: Self-pay

## 2024-05-05 NOTE — Patient Instructions (Signed)
 Lani LITTIE Lunger - I am sorry I was unable to reach you today for our scheduled appointment. I work with Sagardia, Miguel Jose, MD and am calling to support your healthcare needs. Please contact me at [phone] at your earliest convenience. I look forward to speaking with you soon.   Thank you,   Heddy Shutter, RN, MSN, BSN, CCM [place]  [facility], Population Health Case Manager Phone: [phone]

## 2024-05-09 ENCOUNTER — Other Ambulatory Visit: Payer: Self-pay | Admitting: Neurology

## 2024-05-10 ENCOUNTER — Other Ambulatory Visit: Payer: Self-pay

## 2024-05-10 NOTE — Patient Outreach (Signed)
 Complex Care Management   Visit Note  05/10/2024  Name:  Timothy Lynch MRN: [id number] DOB: [birth year]  Situation: Referral received for Complex Care Management related to Mental/Behavioral Health diagnosis anxiety and depression I obtained verbal consent from Patient.  Visit completed with patient  on the phone. LCSW met with patient to discuss follow with [facility]. Patient states that he was unable to follow up with [facility] however patient states that he can follow up with by next visit. Background:   Past Medical History:  Diagnosis Date   Seizures (HCC)     Assessment: Patient Reported Symptoms:  Cognitive Cognitive Status: Normal speech and language skills, Alert and oriented to person, place, and time Cognitive/Intellectual Conditions Management [RPT]: None reported or documented in medical history or problem list      Neurological Neurological Review of Symptoms: Not assessed    HEENT HEENT Symptoms Reported: Not assessed      Cardiovascular Cardiovascular Symptoms Reported: Not assessed    Respiratory Respiratory Symptoms Reported: Not assesed    Endocrine Endocrine Symptoms Reported: Not assessed    Gastrointestinal Gastrointestinal Symptoms Reported: Not assessed      Genitourinary Genitourinary Symptoms Reported: Not assessed    Integumentary Integumentary Symptoms Reported: Not assessed    Musculoskeletal Musculoskelatal Symptoms Reviewed: Not assessed        Psychosocial Psychosocial Symptoms Reported: Depression - if selected complete PHQ 2-9, Anxiety - if selected complete GAD Additional Psychological Details: Patient reports that anxiety and depression is somewhat better. It has not 2 weeks since last assessment therefore they were not completed. Behavioral Management Strategies: Adequate rest, Coping strategies Major Change/Loss/Stressor/Fears (CP): Resources, Environment, School or job Techniques to [employer] with  Loss/Stress/Change:  (Patient is agreeable to counseling and medication management,) Quality of Family Relationships: supportive Do you feel physically threatened by others?: No      04/30/2024    2:16 PM  Depression screen PHQ 2/9  Decreased Interest 3  Down, Depressed, Hopeless 3  PHQ - 2 Score 6  Altered sleeping 3  Tired, decreased energy 3  Change in appetite 0  Feeling bad or failure about yourself  2  Trouble concentrating 3  Moving slowly or fidgety/restless 0  Suicidal thoughts 0  PHQ-9 Score 17  Difficult doing work/chores Extremely dIfficult    There were no vitals filed for this visit.  Medications Reviewed Today     Reviewed by Sherren Olam FORBES KEN (Social Worker) on 05/10/24 at 1255  Med List Status: <None>   Medication Order Taking? Sig Documenting Provider Last Dose Status Informant  cloBAZam  (ONFI ) 10 MG tablet [id number]  Take 1 tablet (10 mg total) by mouth 2 (two) times daily. Gregg Lek, MD  Active   diazePAM , 20 MG Dose, (VALTOCO  20 MG DOSE) 2 x 10 MG/0.1ML LQPK [id number]  Spray 1 vial (10 mg) into each nostril at onset on convulsing and call 911 Gregg Lek, MD  Active            Med Note VERTIS, GLENNIS   Tue Feb 10, 2024 11:07 AM) WHEN NEEDED   hydrocortisone  (ANUSOL -HC) 2.5 % rectal cream [id number]  Place 1 Application rectally 2 (two) times daily. Purcell Emil Schanz, MD  Active             Recommendation:   Continue Current Plan of Care  Follow Up Plan:   Telephone follow-up 05/24/2024 at 1:00 PM  Olam Sherren, MSW, LCSW [place]  Value Based [facility],  Population Health Licensed Nurse, mental health: [phone]

## 2024-05-10 NOTE — Patient Instructions (Signed)
 Visit Information  Thank you for taking time to visit with me today. Please don't hesitate to contact me if I can be of assistance to you before our next scheduled appointment.  Your next care management appointment is by telephone on 05/24/2024  at 1:00 PM   Please call the care guide team at [phone] if you need to cancel, schedule, or reschedule an appointment.   Please call the Suicide and Crisis Lifeline: 988 if you are experiencing a Mental Health or Behavioral Health Crisis or need someone to talk to.  Olam Ally, MSW, LCSW [place]  [facility] Institute, [facility] Health Licensed Clinical Social Worker Direct Dial: [phone]

## 2024-05-24 ENCOUNTER — Other Ambulatory Visit: Payer: Self-pay

## 2024-05-24 NOTE — Patient Instructions (Signed)
 Visit Information  Thank you for taking time to visit with me today. Please don't hesitate to contact me if I can be of assistance to you before our next scheduled appointment.  Your next care management appointment is by telephone on 06/07/2024 at 1:00 PM   Please call the care guide team at [phone] if you need to cancel, schedule, or reschedule an appointment.   Please call the Suicide and Crisis Lifeline: 988 if you are experiencing a Mental Health or Behavioral Health Crisis or need someone to talk to. Olam Ally, MSW, LCSW [place]  [facility] Institute, [facility] Health Licensed Clinical Social Worker Direct Dial: [phone]

## 2024-05-24 NOTE — Patient Outreach (Signed)
 LCSW called patient at scheduled time for visit and was unable to reach him., LCSW  could not leave a voice mail. This the 1st unsuccessful attempt. LCSW will reschedule patient.  Olam Ally, MSW, LCSW [place]  [facility] Institute, [facility] Health Licensed Clinical Social Worker Direct Dial: [phone]

## 2024-06-07 ENCOUNTER — Other Ambulatory Visit: Payer: Self-pay

## 2024-06-07 NOTE — Patient Instructions (Signed)
 Visit Information  Thank you for taking time to visit with me today. Please don't hesitate to contact me if I can be of assistance to you before our next scheduled appointment.  Your next care management appointment is by telephone on 06/16/2024 at 2:00 PM    Please call the care guide team at [phone] if you need to cancel, schedule, or reschedule an appointment.   Please call the Suicide and Crisis Lifeline: 988 if you are experiencing a Mental Health or Behavioral Health Crisis or need someone to talk to.  Olam Ally, MSW, LCSW [place]  [facility] Institute, [facility] Health Licensed Clinical Social Worker Direct Dial: [phone]

## 2024-06-07 NOTE — Patient Outreach (Signed)
 Complex Care Management   Visit Note  06/07/2024  Name:  Timothy Lynch MRN: [id number] DOB: 1996-04-23  Situation: Referral received for Complex Care Management related to Mental/Behavioral Health diagnosis anxiety and depression I obtained verbal consent from Patient.  Visit completed with patient  on the phone. LCSW met with patient to discuss follow with [facility]. Patient states that he was unable to follow up with [facility] however patient states that he can follow on Thursday. Background:   Past Medical History:  Diagnosis Date   Seizures (HCC)     Assessment: Patient Reported Symptoms:  Cognitive Cognitive Status: Normal speech and language skills, Alert and oriented to person, place, and time Cognitive/Intellectual Conditions Management [RPT]: None reported or documented in medical history or problem list      Neurological Neurological Review of Symptoms: No symptoms reported Oher Neurological Symptoms/Conditions [RPT]: Patient reports still shaking, patient has seizures Neurological Management Strategies: Adequate rest  HEENT HEENT Symptoms Reported: No symptoms reported      Cardiovascular Cardiovascular Symptoms Reported: No symptoms reported Does patient have uncontrolled Hypertension?: No    Respiratory Respiratory Symptoms Reported: No symptoms reported    Endocrine Endocrine Symptoms Reported: No symptoms reported Is patient diabetic?: No    Gastrointestinal Gastrointestinal Symptoms Reported: No symptoms reported      Genitourinary Genitourinary Symptoms Reported: No symptoms reported    Integumentary Integumentary Symptoms Reported: Bruising Additional Integumentary Details: Patient reports bruising when he has a fall Skin Management Strategies: Adequate rest  Musculoskeletal Musculoskelatal Symptoms Reviewed: No symptoms reported Additional Musculoskeletal Details: Patient reports no black out or falls in the last  month because of his seizures Musculoskeletal Management Strategies: Adequate rest Falls in the past year?: Yes Number of falls in past year: 2 or more Was there an injury with Fall?: Yes Fall Risk Category Calculator: 3 Patient Fall Risk Level: High Fall Risk Patient at Risk for Falls Due to: History of fall(s) Fall risk Follow up: Falls evaluation completed  Psychosocial Psychosocial Symptoms Reported: Depression - if selected complete PHQ 2-9, Anxiety - if selected complete GAD Additional Psychological Details: Patient reports that depression and anxiety is about the same and now he how a new born that he wants to take care of. Behavioral Management Strategies: Adequate rest, Coping strategies Major Change/Loss/Stressor/Fears (CP): Medical condition, self Behaviors When Feeling Stressed/Fearful: Patient reports worrying Quality of Family Relationships: supportive Do you feel physically threatened by others?: No    06/07/2024    PHQ2-9 Depression Screening   Little interest or pleasure in doing things More than half the days  Feeling down, depressed, or hopeless More than half the days  PHQ-2 - Total Score 4  Trouble falling or staying asleep, or sleeping too much More than half the days  Feeling tired or having little energy Nearly every day  Poor appetite or overeating  Not at all  Feeling bad about yourself - or that you are a failure or have let yourself or your family down More than half the days  Trouble concentrating on things, such as reading the newspaper or watching television Nearly every day  Moving or speaking so slowly that other people could have noticed.  Or the opposite - being so fidgety or restless that you have been moving around a lot more than usual Not at all  Thoughts that you would be better off dead, or hurting yourself in some way Not at all  PHQ2-9 Total Score 14  If you  checked off any problems, how difficult have these problems made it for you to do your  work, take care of things at home, or get along with other people Very difficult  Depression Interventions/Treatment Counseling (Patient reports wanting counseling)    There were no vitals filed for this visit.  Medications Reviewed Today     Reviewed by Sherren Olam FORBES KEN (Social Worker) on 06/07/24 at 1256  Med List Status: <None>   Medication Order Taking? Sig Documenting Provider Last Dose Status Informant  cloBAZam  (ONFI ) 10 MG tablet [id number]  Take 1 tablet (10 mg total) by mouth 2 (two) times daily. Gregg Lek, MD  Active   diazePAM , 20 MG Dose, (VALTOCO  20 MG DOSE) 2 x 10 MG/0.1ML LQPK [id number]  Spray 1 vial (10 mg) into each nostril at onset on convulsing and call 911 Gregg Lek, MD  Active            Med Note VERTIS, LALITA   Tue Feb 10, 2024 11:07 AM) WHEN NEEDED   hydrocortisone  (ANUSOL -HC) 2.5 % rectal cream [id number]  Place 1 Application rectally 2 (two) times daily. Purcell Emil Schanz, MD  Active             Recommendation:   Continue Current Plan of Care  Follow Up Plan:   Telephone follow-up 06/16/2024 2:00 PM  Olam Sherren, MSW, LCSW [place]  Value Based Care Institute, [facility] Health Licensed Clinical Social Worker Direct Dial: [phone]

## 2024-06-16 ENCOUNTER — Other Ambulatory Visit: Payer: Self-pay

## 2024-06-16 NOTE — Patient Instructions (Signed)
 Visit Information  Timothy Lynch - I am sorry I was unable to reach you today for our scheduled appointment. I work with Sagardia, Miguel Jose, MD and am calling to support your healthcare needs. Please contact me at [phone] at your earliest convenience. I look forward to speaking with you soon.   Your next care management appointment is by telephone on 07/06/2024 at 2:00 PM   Please call the care guide team at [phone] if you need to cancel, schedule, or reschedule an appointment.   Please call the Suicide and Crisis Lifeline: 988 if you are experiencing a Mental Health or Behavioral Health Crisis or need someone to talk to.  Olam Ally, MSW, LCSW [place]  [facility] Institute, [facility] Health Licensed Clinical Social Worker Direct Dial: [phone]

## 2024-06-16 NOTE — Patient Outreach (Signed)
 LCSW called patient at scheduled time of visit and was unable to reach patient. LCSW could not leave voice mail. LCSW scheduled another visit for 07/06/2024 at 2:00 PM.  Olam Ally, MSW, LCSW [place]  [facility] Care Institute, [facility] Health Licensed Clinical Social Worker Direct Dial: [phone]

## 2024-07-06 ENCOUNTER — Other Ambulatory Visit: Payer: Self-pay

## 2024-07-06 NOTE — Patient Instructions (Signed)
 Visit Information  Timothy Lynch - I am sorry I was unable to reach you today for our scheduled appointment. I work with Sagardia, Miguel Jose, MD and am calling to support your healthcare needs. Please contact me at [phone] at your earliest convenience. I look forward to speaking with you soon.   Your next care management appointment is by telephone on 07/21/2024 at 2:00 PM   Please call the care guide team at [phone] if you need to cancel, schedule, or reschedule an appointment.   Please call the Suicide and Crisis Lifeline: 988 if you are experiencing a Mental Health or Behavioral Health Crisis or need someone to talk to.  Timothy Lynch, MSW, LCSW [place]  [facility] Institute, [facility] Health Licensed Clinical Social Worker Direct Dial: [phone]

## 2024-07-06 NOTE — Patient Outreach (Signed)
 LCSW called patient for scheduled visit and was unable to reach patient. LCSW could not leave voice mail however scheduled another visit for 07/21/2024 at 2:00PM. This is the second unsuccessful attempt.  Timothy Lynch, MSW, LCSW [place]  [facility] Institute, [facility] Health Licensed Clinical Social Worker Direct Dial: [phone]

## 2024-07-16 ENCOUNTER — Telehealth: Payer: Self-pay

## 2024-07-16 NOTE — Patient Instructions (Signed)
 Timothy Lynch - I am sorry I was unable to reach you today for our scheduled appointment. I work with Sagardia, Miguel Jose, MD and am calling to support your healthcare needs. Please contact me at [phone] at your earliest convenience. I look forward to speaking with you soon.   Thank you,   Heddy Shutter, RN, MSN, BSN, CCM [place]  [facility], Population Health Case Manager Phone: [phone]

## 2024-07-21 ENCOUNTER — Telehealth

## 2024-07-21 ENCOUNTER — Telehealth: Payer: Self-pay | Admitting: *Deleted

## 2024-07-21 ENCOUNTER — Telehealth: Payer: Self-pay | Admitting: Licensed Clinical Social Worker

## 2024-07-21 DIAGNOSIS — R569 Unspecified convulsions: Secondary | ICD-10-CM

## 2024-07-21 NOTE — Progress Notes (Signed)
 Complex Care Management Care Guide Note  07/21/2024 Name: Timothy Lynch MRN: [id number] DOB: May 26, 1996  Timothy Lynch is a 28 y.o. year old male who is a primary care patient of Sagardia, Miguel Jose, MD and is actively engaged with the care management team. I reached out to Timothy Lynch by phone today to assist with scheduling  with the Licensed Clinical Social Worker BSW.  Follow up plan: Telephone appointment with complex care management team member scheduled for:  07/26/24 with BSW and 08/02/24 with LCSW   Harlene [facility]  [facility], [facility] Guide  Direct Dial: [phone]  Fax [phone]

## 2024-07-26 ENCOUNTER — Telehealth: Payer: Self-pay

## 2024-07-26 ENCOUNTER — Other Ambulatory Visit: Payer: Self-pay

## 2024-07-26 NOTE — Patient Outreach (Signed)
 Patient called to reschedule appointment.  Timothy Lynch, BSW [place]  [facility] Institute Social Worker, [employer] Health [phone]

## 2024-07-26 NOTE — Patient Instructions (Signed)
 Lani LITTIE Lunger - I have attempted to call you three times but have been unsuccessful in reaching you. I work with Sagardia, Miguel Jose, MD and am calling to support your healthcare needs. If I can be of assistance to you, please contact me at [phone].     Thank you,  Orlean Fey, BSW [place]  [facility] Social Worker, [employer] [phone]

## 2024-07-28 ENCOUNTER — Other Ambulatory Visit: Payer: Self-pay

## 2024-07-28 NOTE — Patient Instructions (Signed)
 Visit Information  Thank you for taking time to visit with me today. Please don't hesitate to contact me if I can be of assistance to you before our next scheduled appointment.  Our next appointment is no further scheduled appointments.   Please call the care guide team at [phone] if you need to cancel or reschedule your appointment.      Please call the Suicide and Crisis Lifeline: 988 call the [place]  National Suicide Prevention Lifeline: 717-250-0319 or TTY: 845-734-9509 TTY 816-195-7308) to talk to a trained counselor call 1-800-273-TALK (toll free, 24 hour hotline) go to [facility] [address] [place] [phone]) call 911 if you are experiencing a Mental Health or Behavioral Health Crisis or need someone to talk to.  Patient verbalized understanding of Care plan and visit instructions communicated this visit  Orlean Fey, BSW [facility]  [facility] Social Worker, [employer] [facility] [phone]

## 2024-07-28 NOTE — Patient Outreach (Signed)
 Social Drivers of Health  Community Resource and Care Coordination Visit Note   07/28/2024  Name: LAYDON MARTIS MRN: [id number] DOB:[birth year]  Situation: Referral received for [facility] needs assessment and assistance related to Transportation and Scheduling with [facility]. I obtained verbal consent from Patient.  Visit completed with Patient on the phone.   Background:      Assessment:   BSW outreached patient today to assist with scheduling a behavioral health appointment and transportation. During the call, patient stated that he would like to make an appointment with [facility] and would need to arrange transportation as well. BSW called with patient to [facility] and was able to schedule a virtual appointment for Friday, where patient will be assessed and assigned to a therapist. Since the appointment is virtual, transportation will not be needed at this time. Patient also shared that his insurance is set to expire on July 30, 2024, and that he plans to visit DSS on Monday to have it reinstated. BSW advised patient to reach out if he has any further questions or needs additional assistance with transportation in the future.  Recommendation:   No recommendations at this time.  Follow Up Plan:   Patient has achieved all patient stated goals. [employer] will be closed. Patient has been provided contact information should new needs arise.   Orlean Fey, BSW [place]  [facility] Social Worker, [employer] [facility] [phone]

## 2024-07-30 ENCOUNTER — Encounter (HOSPITAL_COMMUNITY): Payer: Self-pay

## 2024-07-30 ENCOUNTER — Ambulatory Visit (HOSPITAL_COMMUNITY): Admitting: Mental Health

## 2024-08-02 ENCOUNTER — Other Ambulatory Visit: Payer: Self-pay | Admitting: Licensed Clinical Social Worker

## 2024-08-02 NOTE — Patient Instructions (Signed)
 Visit Information  Thank you for taking time to visit with me today. Please don't hesitate to contact me if I can be of assistance to you before our next scheduled appointment.  Our next appointment is by telephone on 08/16/24 at 2:30pm. Please call the care guide team at [phone] if you need to cancel or reschedule your appointment.   Following is a copy of your care plan:   Goals Addressed             This Visit's Progress    VBCI Social Work Care Plan;LCSW       Problems:   Disease Management support and education needs related to Anxiety with Excessive Worry, and Depression: depressed mood  CSW Clinical Goal(s):   Over the next 90 days the Patient will work with Child Psychotherapist to address concerns related to anxiety and depression.  Interventions:  Mental Health:  Evaluation of current treatment plan related to Anxiety with Excessive Worry, and Depression: depressed mood 06/07/2024 Active listening / Reflection utilized Behavioral Activation reviewed PHQ2/PHQ9 completed Depression screen reviewed GAD 7 completed and reviewed LCSW follow up with patient about going to [facility]. LCSW discussed their open hours assessments. Patient stated that he would try to go Thursday.  Emotional Support Provided Motivational Interviewing employed Suicidal Ideation/Homicidal Ideation assessed:No plan or intent LCSW discuss positive self talk that patient reports utilizing Patient provided with the 988 crisis line.  Patient is still amenable to medication management and therapy. 08/02/24 Patients medicaid expired 07/30/24 Discussed missed behavioral health appointment due to over sleeping Discussed the importance of getting medicaid reinstated to attend all appointments Patient Goals/Self-Care Activities:  Connect with provider for ongoing mental health treatment.   Over the next 2 weeks patient will go to DSS to get medicaid reinstated, once completed  patient will reschedule intake assessment for outpatient therapy.  Plan:   Telephone follow up appointment with care management team member scheduled for:  06/16/2024  at 2:00 PM        Please call the Suicide and Crisis Lifeline: 988 call the [place]  National Suicide Prevention Lifeline: (919)397-5307 or TTY: (709)278-7388 TTY (780)789-1425) to talk to a trained counselor call 1-800-273-TALK (toll free, 24 hour hotline) call 911 if you are experiencing a Mental Health or Behavioral Health Crisis or need someone to talk to.  Patient verbalized understanding of Care plan and visit instructions communicated this visit  Cena Ligas, LCSW Clinical Social Worker [facility] [employer]

## 2024-08-02 NOTE — Patient Outreach (Signed)
 Complex Care Management   Visit Note  08/02/2024  Name:  Timothy Lynch MRN: [id number] DOB: [birth year]  Situation: Referral received for Complex Care Management related to SDOH Barriers:  Transportation and Financial Resource Strain I obtained verbal consent from Patient.  Visit completed with Patient  on the phone. LCSW spoke to patient over the phone. LCSW inquired about behavioral health appointment, patient stated he overslept and did not make the appointment. Patient states that his medicaid has also expired and he plans on going to DSS tomorrow to get it reinstated. Patient stated that once he has his medicaid reinstated he will make appointment after.   Background:   Past Medical History:  Diagnosis Date   Seizures (HCC)     Assessment: Patient Reported Symptoms:  Cognitive Cognitive Status: Normal speech and language skills, Alert and oriented to person, place, and time Cognitive/Intellectual Conditions Management [RPT]: None reported or documented in medical history or problem list   Health Maintenance Behaviors: Annual physical exam Healing Pattern: Average Health Facilitated by: Stress management  Neurological Neurological Review of Symptoms: Not assessed    HEENT HEENT Symptoms Reported: Not assessed      Cardiovascular Cardiovascular Symptoms Reported: Not assessed    Respiratory Respiratory Symptoms Reported: Not assesed    Endocrine Endocrine Symptoms Reported: Not assessed    Gastrointestinal Gastrointestinal Symptoms Reported: Not assessed      Genitourinary Genitourinary Symptoms Reported: Not assessed    Integumentary Integumentary Symptoms Reported: Not assessed    Musculoskeletal Musculoskelatal Symptoms Reviewed: Not assessed        Psychosocial Psychosocial Symptoms Reported: Not assessed          08/02/2024    PHQ2-9 Depression Screening   Little interest or pleasure in doing things    Feeling down, depressed, or hopeless    PHQ-2 - Total  Score    Trouble falling or staying asleep, or sleeping too much    Feeling tired or having little energy    Poor appetite or overeating     Feeling bad about yourself - or that you are a failure or have let yourself or your family down    Trouble concentrating on things, such as reading the newspaper or watching television    Moving or speaking so slowly that other people could have noticed.  Or the opposite - being so fidgety or restless that you have been moving around a lot more than usual    Thoughts that you would be better off dead, or hurting yourself in some way    PHQ2-9 Total Score    If you checked off any problems, how difficult have these problems made it for you to do your work, take care of things at home, or get along with other people    Depression Interventions/Treatment      There were no vitals filed for this visit.  Medications Reviewed Today     Reviewed by Veva Bolt, LCSW (Social Worker) on 08/02/24 at 1418  Med List Status: <None>   Medication Order Taking? Sig Documenting Provider Last Dose Status Informant  cloBAZam  (ONFI ) 10 MG tablet [id number]  Take 1 tablet (10 mg total) by mouth 2 (two) times daily. Gregg Lek, MD  Active   diazePAM , 20 MG Dose, (VALTOCO  20 MG DOSE) 2 x 10 MG/0.1ML LQPK [id number]  Spray 1 vial (10 mg) into each nostril at onset on convulsing and call 911 Gregg Lek, MD  Active  Med Note VERTIS, LALITA   Tue Feb 10, 2024 11:07 AM) WHEN NEEDED   hydrocortisone  (ANUSOL -HC) 2.5 % rectal cream [id number]  Place 1 Application rectally 2 (two) times daily. Purcell Emil Schanz, MD  Active             Recommendation:   PCP Follow-up Continue Current Plan of Care  Follow Up Plan:   Telephone follow up appointment date/time:  08/16/24 @ 2:30pm.  Cena Ligas, LCSW Clinical Social Worker [facility]

## 2024-08-16 ENCOUNTER — Telehealth: Payer: Self-pay | Admitting: Licensed Clinical Social Worker

## 2024-08-16 ENCOUNTER — Encounter: Payer: Self-pay | Admitting: Licensed Clinical Social Worker

## 2024-08-16 NOTE — Patient Instructions (Signed)
 Lani LITTIE Lunger - I am sorry I was unable to reach you today for our scheduled appointment. I work with Sagardia, Miguel Jose, MD and am calling to support your healthcare needs. Please contact me at [phone] at your earliest convenience. I look forward to speaking with you soon.   Thank you,  Cena Ligas, LCSW Clinical Social Worker [facility]

## 2024-08-31 ENCOUNTER — Encounter: Payer: Self-pay | Admitting: Licensed Clinical Social Worker

## 2024-08-31 ENCOUNTER — Telehealth: Payer: Self-pay | Admitting: Licensed Clinical Social Worker

## 2024-08-31 NOTE — Patient Instructions (Signed)
 Lani LITTIE Lunger - I am sorry I was unable to reach you today for our scheduled appointment. I work with Sagardia, Miguel Jose, MD and am calling to support your healthcare needs. Please contact me at [phone] at your earliest convenience. I look forward to speaking with you soon.   Thank you,  Cena Ligas, LCSW Clinical Social Worker [facility]

## 2024-09-14 ENCOUNTER — Other Ambulatory Visit: Payer: Self-pay | Admitting: Licensed Clinical Social Worker

## 2024-09-14 NOTE — Patient Outreach (Signed)
 Complex Care Management   Visit Note  09/14/2024  Name:  Timothy Lynch MRN: [id number] DOB: 05-14-1996  Situation: Referral received for Complex Care Management related to SDOH Barriers:  Housing. I obtained verbal consent from Patient.  Visit completed with Patient  on the phone  Background:   Past Medical History:  Diagnosis Date   Seizures (HCC)     Assessment:  LCSW met with patient on the phone. Patient reports that he was able to get his medicaid reinstated. Patient reports that his SSDI has been approved and he now has a monthly income. Patient stated that he is not interested in therapy at this time but does feel like he could benefit from anxiety medication. Patient also stated he has been having tremors in his hand and he feels that he needs to see his provider. LCSW will sent in basket message to PCP. Patient shared that he is also homeless and has been couch surfing for a few months. Patient reports he has applied for 3 appartment complexes but he gets denied because he does not make 3x the rent. Patient is asking for resources that can assist in him securing a place to live.   Patient Reported Symptoms:  Cognitive Cognitive Status: Alert and oriented to person, place, and time, Normal speech and language skills Cognitive/Intellectual Conditions Management [RPT]: None reported or documented in medical history or problem list      Neurological Neurological Review of Symptoms: Other: [place] Neurological Symptoms/Conditions [RPT]: Reports his right hand twitches occasionally Neurological Management Strategies: Routine screening, Medication therapy Neurological Self-Management Outcome: 4 (good)  HEENT HEENT Symptoms Reported: No symptoms reported      Cardiovascular Cardiovascular Symptoms Reported: No symptoms reported    Respiratory Respiratory Symptoms Reported: No symptoms reported    Endocrine Endocrine Symptoms Reported: No symptoms reported    Gastrointestinal  Gastrointestinal Symptoms Reported: No symptoms reported      Genitourinary Genitourinary Symptoms Reported: No symptoms reported    Integumentary Integumentary Symptoms Reported: No symptoms reported    Musculoskeletal Musculoskelatal Symptoms Reviewed: No symptoms reported        Psychosocial Psychosocial Symptoms Reported: Other Other Psychosocial Conditions: believes his anxiety is from being homeless Additional Psychological Details: reports occasional anxiety Behavioral Management Strategies: Community resources, Support system, Activity Behavioral Health Self-Management Outcome: 4 (good) Major Change/Loss/Stressor/Fears (CP): Environment Techniques to [employer] with Loss/Stress/Change: None      09/14/2024    PHQ2-9 Depression Screening   Little interest or pleasure in doing things    Feeling down, depressed, or hopeless    PHQ-2 - Total Score    Trouble falling or staying asleep, or sleeping too much    Feeling tired or having little energy    Poor appetite or overeating     Feeling bad about yourself - or that you are a failure or have let yourself or your family down    Trouble concentrating on things, such as reading the newspaper or watching television    Moving or speaking so slowly that other people could have noticed.  Or the opposite - being so fidgety or restless that you have been moving around a lot more than usual    Thoughts that you would be better off dead, or hurting yourself in some way    PHQ2-9 Total Score    If you checked off any problems, how difficult have these problems made it for you to do your work, take care of things at home, or get along with  other people    Depression Interventions/Treatment      There were no vitals filed for this visit.    Medications Reviewed Today   Medications were not reviewed in this encounter     Recommendation:   PCP Follow-up Continue Current Plan of Care  Follow Up Plan:   Telephone follow up appointment  date/time:  09/17/2024  at 2:00 PM  with BSW, and 09/28/24 at 2pm with LCSW.   Cena Ligas, LCSW Clinical Social Worker [facility]

## 2024-09-14 NOTE — Patient Instructions (Signed)
 Visit Information  Mr. Timothy Lynch was given information about Medicaid Managed Care team care coordination services as a part of their Healthy [facility] Medicaid benefit. Timothy Lynch   If you would like to schedule transportation through your Healthy [facility] plan, please call the following number at least 2 days in advance of your appointment: [phone]  For information about your ride after you set it up, call Ride Assist at [phone]. Use this number to activate a Will Call pickup, or if your transportation is late for a scheduled pickup. Use this number, too, if you need to make a change or cancel a previously scheduled reservation.  If you need transportation services right away, call [phone]. The after-hours call center is staffed 24 hours to handle ride assistance and urgent reservation requests (including discharges) 365 days a year. Urgent trips include sick visits, hospital discharge requests and life-sustaining treatment.  Call the [facility] Line at [phone], at any time, 24 hours a day, 7 days a week. If you are in danger or need immediate medical attention call 911.   Please see education materials related to community resources provided by e-mail link.  Patient verbalizes understanding of instructions and care plan provided today and agrees to view in MyChart. Active MyChart status and patient understanding of how to access instructions and care plan via MyChart confirmed with patient.     Telephone follow up appointment date/time:  09/17/2024  at 2:00 PM  with BSW, and 09/28/24 at 2pm with LCSW.   Timothy Ligas, LCSW Clinical Social Worker [facility]  Following is a copy of your plan of care:   Goals Addressed             This Visit's Progress    VBCI Social Work Care Plan;LCSW       Problems:   Disease Management support and education needs related to Anxiety with Excessive Worry, and Depression: depressed mood  Patient is  currently homeless and looking for permament housing.   CSW Clinical Goal(s):   Over the next 90 days the Patient will explore community resource options for unmet needs related to Housing  work with Child Psychotherapist to address concerns related to anxiety and depression.- 09/14/24 patient not interested in a therapist currently  Over the next 90 days patient will work with LCSW and BSW to address unmet housing needs as evidence by receiving and reviewing community resources.   Interventions:  Mental Health:  Evaluation of current treatment plan related to Anxiety with Excessive Worry, and Depression: depressed mood 06/07/2024 Active listening / Reflection utilized Behavioral Activation reviewed PHQ2/PHQ9 completed Depression screen reviewed GAD 7 completed and reviewed LCSW follow up with patient about going to [facility]. LCSW discussed their open hours assessments. Patient stated that he would try to go Thursday.  Emotional Support Provided Motivational Interviewing employed Suicidal Ideation/Homicidal Ideation assessed:No plan or intent LCSW discuss positive self talk that patient reports utilizing Patient provided with the 988 crisis line.  Patient is still amenable to medication management and therapy. 08/02/24 Patients medicaid expired 07/30/24 Discussed missed behavioral health appointment due to over sleeping Discussed the importance of getting medicaid reinstated to attend all appointments 09/14/24 [place] Patient reports he is now receiving disability Patient reports he is homeless and is having difficulty finding a place that will rent to him  Patient Goals/Self-Care Activities:  Connect with provider for ongoing mental health treatment.   Over the next 2 weeks patient will go  to DSS to get medicaid reinstated, once completed patient will reschedule intake assessment for outpatient therapy.  Plan:   Telephone follow up  appointment with care management team member scheduled for:  09/17/2024  at 2:00 PM with BSW, and 09/28/24 at 2pm with LCSW.

## 2024-09-17 ENCOUNTER — Other Ambulatory Visit: Payer: Self-pay

## 2024-09-17 NOTE — Patient Instructions (Signed)
 Lani LITTIE Lunger - I am sorry I was unable to reach you today for our scheduled appointment. I work with Sagardia, Miguel Jose, MD and am calling to support your healthcare needs. Please contact me at [phone] at your earliest convenience. I look forward to speaking with you soon.   Thank you,  Orlean Fey, BSW [place]  [facility] Social Worker, [employer] [phone]

## 2024-09-28 ENCOUNTER — Other Ambulatory Visit: Payer: Self-pay | Admitting: Licensed Clinical Social Worker

## 2024-09-28 NOTE — Patient Outreach (Incomplete Revision)
 Complex Care Management   Visit Note  09/28/2024  Name:  Timothy Lynch MRN: [id number] DOB: 12/10/1995  Situation: Referral received for Complex Care Management related to SDOH Barriers:  Housing.  I obtained verbal consent from Patient.  Visit completed with Patient  on the phone  Background:   Past Medical History:  Diagnosis Date   Seizures (HCC)     Assessment: LCSW spoke to patient on the phone. Patient Reported Symptoms:  Cognitive Cognitive Status: Alert and oriented to person, place, and time, Normal speech and language skills Cognitive/Intellectual Conditions Management [RPT]: None reported or documented in medical history or problem list      Neurological Neurological Review of Symptoms: Not assessed    HEENT HEENT Symptoms Reported: Not assessed      Cardiovascular Cardiovascular Symptoms Reported: Not assessed    Respiratory Respiratory Symptoms Reported: Not assesed    Endocrine Endocrine Symptoms Reported: Not assessed    Gastrointestinal Gastrointestinal Symptoms Reported: Not assessed      Genitourinary Genitourinary Symptoms Reported: Not assessed    Integumentary Integumentary Symptoms Reported: Not assessed    Musculoskeletal Musculoskelatal Symptoms Reviewed: Not assessed        Psychosocial Psychosocial Symptoms Reported: No symptoms reported          09/28/2024    PHQ2-9 Depression Screening   Little interest or pleasure in doing things Not at all  Feeling down, depressed, or hopeless Not at all  PHQ-2 - Total Score 0  Trouble falling or staying asleep, or sleeping too much    Feeling tired or having little energy    Poor appetite or overeating     Feeling bad about yourself - or that you are a failure or have let yourself or your family down    Trouble concentrating on things, such as reading the newspaper or watching television    Moving or speaking so slowly that other people could have noticed.  Or the opposite - being so fidgety  or restless that you have been moving around a lot more than usual    Thoughts that you would be better off dead, or hurting yourself in some way    PHQ2-9 Total Score    If you checked off any problems, how difficult have these problems made it for you to do your work, take care of things at home, or get along with other people    Depression Interventions/Treatment      There were no vitals filed for this visit.    Medications Reviewed Today     Reviewed by Veva Bolt, LCSW (Social Worker) on 09/28/24 at 1411  Med List Status: <None>   Medication Order Taking? Sig Documenting Provider Last Dose Status Informant  cloBAZam  (ONFI ) 10 MG tablet [id number]  Take 1 tablet (10 mg total) by mouth 2 (two) times daily. Gregg Lek, MD  Active   diazePAM , 20 MG Dose, (VALTOCO  20 MG DOSE) 2 x 10 MG/0.1ML LQPK [id number]  Spray 1 vial (10 mg) into each nostril at onset on convulsing and call 911 Gregg Lek, MD  Active            Med Note VERTIS, LALITA   Tue Feb 10, 2024 11:07 AM) WHEN NEEDED   hydrocortisone  (ANUSOL -HC) 2.5 % rectal cream [id number]  Place 1 Application rectally 2 (two) times daily.  Patient not taking: Reported on 09/14/2024   Sagardia, Miguel Jose, MD  Active             Recommendation:  PCP Follow-up Continue Current Plan of Care  Follow Up Plan:   Telephone follow up appointment date/time:  10/19/24 at 2pm.  Cena Ligas, LCSW Clinical Social Worker [facility]

## 2024-09-28 NOTE — Patient Outreach (Signed)
 Complex Care Management   Visit Note  09/28/2024  Name:  Timothy Lynch MRN: [id number] DOB: 1995/10/02  Situation: Referral received for Complex Care Management related to SDOH Barriers:  Housing.  I obtained verbal consent from Patient.  Visit completed with Patient  on the phone.  Background:   Past Medical History:  Diagnosis Date   Seizures (HCC)     Assessment: LCSW spoke to patient on the phone. Patient reports nothing has changed with his current living situation. LCSW and patient discussed answering the phone for the BSW on 10/05/24. Patient reports he will put it in his calendar and make sure he is available.   Patient Reported Symptoms:  Cognitive Cognitive Status: Alert and oriented to person, place, and time, Normal speech and language skills Cognitive/Intellectual Conditions Management [RPT]: None reported or documented in medical history or problem list      Neurological Neurological Review of Symptoms: Not assessed    HEENT HEENT Symptoms Reported: Not assessed      Cardiovascular Cardiovascular Symptoms Reported: Not assessed    Respiratory Respiratory Symptoms Reported: Not assesed    Endocrine Endocrine Symptoms Reported: Not assessed    Gastrointestinal Gastrointestinal Symptoms Reported: Not assessed      Genitourinary Genitourinary Symptoms Reported: Not assessed    Integumentary Integumentary Symptoms Reported: Not assessed    Musculoskeletal Musculoskelatal Symptoms Reviewed: Not assessed        Psychosocial Psychosocial Symptoms Reported: No symptoms reported          09/28/2024    PHQ2-9 Depression Screening   Little interest or pleasure in doing things Not at all  Feeling down, depressed, or hopeless Not at all  PHQ-2 - Total Score 0  Trouble falling or staying asleep, or sleeping too much    Feeling tired or having little energy    Poor appetite or overeating     Feeling bad about yourself - or that you are a failure or have let  yourself or your family down    Trouble concentrating on things, such as reading the newspaper or watching television    Moving or speaking so slowly that other people could have noticed.  Or the opposite - being so fidgety or restless that you have been moving around a lot more than usual    Thoughts that you would be better off dead, or hurting yourself in some way    PHQ2-9 Total Score    If you checked off any problems, how difficult have these problems made it for you to do your work, take care of things at home, or get along with other people    Depression Interventions/Treatment      There were no vitals filed for this visit.    Medications Reviewed Today     Reviewed by Veva Bolt, LCSW (Social Worker) on 09/28/24 at 1411  Med List Status: <None>   Medication Order Taking? Sig Documenting Provider Last Dose Status Informant  cloBAZam  (ONFI ) 10 MG tablet [id number]  Take 1 tablet (10 mg total) by mouth 2 (two) times daily. Gregg Lek, MD  Active   diazePAM , 20 MG Dose, (VALTOCO  20 MG DOSE) 2 x 10 MG/0.1ML LQPK [id number]  Spray 1 vial (10 mg) into each nostril at onset on convulsing and call 911 Gregg Lek, MD  Active            Med Note VERTIS, LALITA   Tue Feb 10, 2024 11:07 AM) WHEN NEEDED   hydrocortisone  (ANUSOL -HC) 2.5 % rectal  cream [id number]  Place 1 Application rectally 2 (two) times daily.  Patient not taking: Reported on 09/14/2024   Sagardia, Miguel Jose, MD  Active             Recommendation:   PCP Follow-up Continue Current Plan of Care  Follow Up Plan:   Telephone follow up appointment date/time:  10/19/24 at 2pm.  Cena Ligas, LCSW Clinical Social Worker [facility]

## 2024-09-29 NOTE — Patient Instructions (Signed)
 Visit Information  Thank you for taking time to visit with me today. Please don't hesitate to contact me if I can be of assistance to you before our next scheduled appointment.  Our next appointment is by telephone on 10/19/24 at 2pm. Please call the care guide team at [phone] if you need to cancel or reschedule your appointment.   Following is a copy of your care plan:   Goals Addressed             This Visit's Progress    [facility] Social Work Care Plan;LCSW       Problems:   Disease Management support and education needs related to Anxiety with Excessive Worry, and Depression: depressed mood  Patient is currently homeless and looking for permament housing.   CSW Clinical Goal(s):   Over the next 90 days the Patient will explore community resource options for unmet needs related to Housing  work with Child Psychotherapist to address concerns related to anxiety and depression.- 09/14/24 patient not interested in a therapist currently  Over the next 90 days patient will work with LCSW and BSW to address unmet housing needs as evidence by receiving and reviewing community resources.   Interventions:  Mental Health:  Evaluation of current treatment plan related to Anxiety with Excessive Worry, and Depression: depressed mood 06/07/2024 Active listening / Reflection utilized Behavioral Activation reviewed PHQ2/PHQ9 completed Depression screen reviewed GAD 7 completed and reviewed LCSW follow up with patient about going to [facility]. LCSW discussed their open hours assessments. Patient stated that he would try to go Thursday.  Emotional Support Provided Motivational Interviewing employed Suicidal Ideation/Homicidal Ideation assessed:No plan or intent LCSW discuss positive self talk that patient reports utilizing Patient provided with the 988 crisis line.  Patient is still amenable to medication management and therapy. 08/02/24 Patients medicaid expired  07/30/24 Discussed missed behavioral health appointment due to over sleeping Discussed the importance of getting medicaid reinstated to attend all appointments 09/14/24 [place] med[place] is [place] Patient reports he is now receiving disability Patient reports he is homeless and is having difficulty finding a place that will rent to him  Patient Goals/Self-Care Activities:  Connect with provider for ongoing mental health treatment.   Over the next 2 weeks patient will go to DSS to get medicaid reinstated, once completed patient will reschedule intake assessment for outpatient therapy.  Plan:   Telephone follow up appointment with care management team member scheduled for:  10/19/2024  at 2:00 PM         Please call 911 if you are experiencing a Mental Health or Behavioral Health Crisis or need someone to talk to.  Patient verbalized understanding of Care plan and visit instructions communicated this visit  Cena Ligas, LCSW Clinical Social Worker [facility] [employer]

## 2024-10-05 ENCOUNTER — Other Ambulatory Visit: Payer: Self-pay

## 2024-10-05 NOTE — Patient Instructions (Signed)
 Visit Information  Thank you for taking time to visit with me today. Please don't hesitate to contact me if I can be of assistance to you before our next scheduled appointment.  Our next appointment is by telephone on 10/13/24  at 2pm Please call the care guide team at [phone] if you need to cancel or reschedule your appointment.   Following is a copy of your care plan:   Goals Addressed             This Visit's Progress    BSW goals       Current SDOH Barriers:  Limited access to food Housing barriers  Interventions: Patient interviewed and appropriate screenings performed Referred patient to community resources  Provided patient with information about [facility] Discussed plans with patient for ongoing follow up and provided patient with direct contact number          Please call the Suicide and Crisis Lifeline: 988 call the [place]  National Suicide Prevention Lifeline: 8581636422 or TTY: 680-482-6950 TTY 386 342 2078) to talk to a trained counselor call 1-800-273-TALK (toll free, 24 hour hotline) go to [facility] [address] [place] [phone]) call 911 if you are experiencing a Mental Health or Behavioral Health Crisis or need someone to talk to.  Patient verbalized understanding of Care plan and visit instructions communicated this visit  Orlean Fey, BSW [facility]  [facility] Social Worker, [employer] [facility] [phone]

## 2024-10-05 NOTE — Patient Outreach (Signed)
 Social Drivers of Health  Community Resource and Care Coordination Visit Note   10/05/2024  Name: Timothy Lynch MRN: [id number] DOB:Feb 17, 1996  Situation: Referral received for [facility] needs assessment and assistance related to Housing  Financial [employer] . I obtained verbal consent from Patient.  Visit completed with Patient on the phone.   Background:      Assessment:   BSW outreached patient today to assess for SDOH barriers. During the call, patient reported that he is currently homeless and couch surfing between different locations. Patient stated that his baby and the mother of his child are currently staying with her aunt; however, he is unsure how much longer they will be able to remain there and expressed concern about their housing stability. Patient stated that he would like to secure stable housing for himself and his family. Patient reported that he is currently on the waiting list for [facility] but has not followed up recently to confirm his position on the list. Patient stated that he is now receiving disability benefits in the amount of $1,147 per month and has enough funds saved for a housing deposit. However, patient reported difficulty securing housing due to income requirements, as many landlords require income equal to at least twice the monthly rent. BSW advised patient of next steps and informed him that BSW will attempt to contact [facility] on his behalf for an update. BSW will also send patient a list of additional housing resources, including [facility], to expand housing options. BSW will follow up with patient on January 14 at 2:00 PM.   Goals Addressed             This Visit's Progress    BSW goals       Current SDOH Barriers:  Limited access to food Housing barriers  Interventions: Patient interviewed and appropriate screenings performed Referred patient to community resources  Provided patient with  information about [facility] Discussed plans with patient for ongoing follow up and provided patient with direct contact number          Recommendation:   attend all scheduled provider appointments call for transportation assistance at least one week before appointments ask for help if you don't understand your health insurance benefits follow up with [facility] regarding housing needs  Follow Up Plan:   Telephone follow-up in 1 week  Orlean Fey, BSW [facility]  [facility] Social Worker, [employer] [phone]

## 2024-10-13 ENCOUNTER — Other Ambulatory Visit: Payer: Self-pay

## 2024-10-13 NOTE — Patient Instructions (Signed)

## 2024-10-13 NOTE — Patient Outreach (Signed)
 Social Drivers of Health  Community Resource and Care Coordination Visit Note   10/13/2024  Name: MCCLAIN SHALL MRN: [id number] DOB:17-May-1996  Situation: Referral received for [facility] needs assessment and assistance related to Housing . I obtained verbal consent from Patient.  Visit completed with Patient on the phone.   Background:      Assessment:   BSW outreached patient today to confirm receipt of previously provided resources. Patient stated that he has received the resources and plans to review them. BSW informed patient to reach out if he has any further questions or needs additional assistance. Patient verbalized understanding. BSW will close the case at this time.    Goals Addressed             This Visit's Progress    COMPLETED: BSW goals       Current SDOH Barriers:  Limited access to food Housing barriers  Interventions: Patient interviewed and appropriate screenings performed Referred patient to community resources  Provided patient with information about [facility] Discussed plans with patient for ongoing follow up and provided patient with direct contact number          Recommendation:   attend all scheduled provider appointments call for transportation assistance at least one week before appointments ask for help if you don't understand your health insurance benefits follow up with [facility] needs  Follow Up Plan:   Patient has achieved all patient stated goals. [employer] will be closed. Patient has been provided contact information should new needs arise.   Orlean Fey, BSW [place]  [facility] Social Worker, [employer] [facility] [phone]

## 2024-10-19 ENCOUNTER — Telehealth: Payer: Self-pay | Admitting: Licensed Clinical Social Worker

## 2024-10-19 ENCOUNTER — Encounter: Payer: Self-pay | Admitting: Licensed Clinical Social Worker

## 2024-10-19 NOTE — Patient Instructions (Signed)
 Lani LITTIE Lunger - I am sorry I was unable to reach you today for our scheduled appointment. I work with Sagardia, Miguel Jose, MD and am calling to support your healthcare needs. Please contact me at [phone] at your earliest convenience. I look forward to speaking with you soon.   Thank you,  Cena Ligas, LCSW Clinical Social Worker [facility]

## 2024-11-03 ENCOUNTER — Encounter: Payer: Self-pay | Admitting: Licensed Clinical Social Worker

## 2024-11-03 ENCOUNTER — Telehealth: Payer: Self-pay | Admitting: Licensed Clinical Social Worker

## 2024-11-03 NOTE — Patient Instructions (Signed)
 Lani LITTIE Lunger - I am sorry I was unable to reach you today for our scheduled appointment. I work with Sagardia, Miguel Jose, MD and am calling to support your healthcare needs. Please contact me at [phone] at your earliest convenience. I look forward to speaking with you soon.   Thank you,  Cena Ligas, LCSW Clinical Social Worker [facility]

## 2024-12-08 ENCOUNTER — Ambulatory Visit: Admitting: Neurology
